# Patient Record
Sex: Female | Born: 1965
Health system: Southern US, Community
[De-identification: ages and names within clinical notes are randomized; demographics above are authoritative.]

## PROBLEM LIST (undated history)

## (undated) DIAGNOSIS — S22050D Wedge compression fracture of T5-T6 vertebra, subsequent encounter for fracture with routine healing: Secondary | ICD-10-CM

## (undated) DIAGNOSIS — N951 Menopausal and female climacteric states: Secondary | ICD-10-CM

## (undated) DIAGNOSIS — S12500A Unspecified displaced fracture of sixth cervical vertebra, initial encounter for closed fracture: Secondary | ICD-10-CM

## (undated) DIAGNOSIS — I10 Essential (primary) hypertension: Secondary | ICD-10-CM

## (undated) DIAGNOSIS — S129XXA Fracture of neck, unspecified, initial encounter: Secondary | ICD-10-CM

## (undated) DIAGNOSIS — E785 Hyperlipidemia, unspecified: Secondary | ICD-10-CM

## (undated) HISTORY — DX: Fracture of neck, unspecified, initial encounter: S12.9XXA

## (undated) HISTORY — DX: Menopausal and female climacteric states: N95.1

## (undated) HISTORY — DX: Essential (primary) hypertension: I10

## (undated) HISTORY — DX: Hyperlipidemia, unspecified: E78.5

---
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
## 2005-12-24 ENCOUNTER — Ambulatory Visit: Payer: Self-pay | Admitting: Obstetrics and Gynecology

## 2006-05-03 HISTORY — PX: BILATERAL SALPINGOOPHORECTOMY: SHX1223

## 2007-02-15 ENCOUNTER — Ambulatory Visit: Payer: Self-pay | Admitting: Obstetrics and Gynecology

## 2007-05-04 HISTORY — PX: ABDOMINAL HYSTERECTOMY: SHX81

## 2008-02-20 ENCOUNTER — Ambulatory Visit: Payer: Self-pay | Admitting: Obstetrics and Gynecology

## 2008-04-02 ENCOUNTER — Ambulatory Visit: Payer: Self-pay | Admitting: Obstetrics and Gynecology

## 2008-04-08 ENCOUNTER — Ambulatory Visit: Payer: Self-pay | Admitting: Obstetrics and Gynecology

## 2008-12-08 ENCOUNTER — Emergency Department: Payer: Self-pay | Admitting: Emergency Medicine

## 2010-02-17 ENCOUNTER — Ambulatory Visit: Payer: Self-pay | Admitting: Internal Medicine

## 2012-08-15 ENCOUNTER — Ambulatory Visit: Payer: Self-pay | Admitting: Obstetrics and Gynecology

## 2012-08-18 ENCOUNTER — Ambulatory Visit: Payer: Self-pay | Admitting: Obstetrics and Gynecology

## 2015-03-24 ENCOUNTER — Other Ambulatory Visit: Payer: Self-pay | Admitting: Neurology

## 2015-03-24 DIAGNOSIS — R202 Paresthesia of skin: Secondary | ICD-10-CM

## 2015-04-09 LAB — HEPATIC FUNCTION PANEL
ALT: 10 U/L (ref 7–35)
AST: 14 U/L (ref 13–35)

## 2015-04-09 LAB — BASIC METABOLIC PANEL
CREATININE: 0.8 mg/dL (ref 0.5–1.1)
Glucose: 86 mg/dL
Potassium: 4.7 mmol/L (ref 3.4–5.3)
SODIUM: 140 mmol/L (ref 137–147)

## 2015-04-14 ENCOUNTER — Ambulatory Visit: Payer: Self-pay

## 2015-04-16 ENCOUNTER — Ambulatory Visit
Admission: RE | Admit: 2015-04-16 | Discharge: 2015-04-16 | Disposition: A | Discharge status: Home / Self Care | Payer: Commercial Managed Care - HMO | Source: Ambulatory Visit | Attending: Neurology | Admitting: Neurology

## 2015-04-16 DIAGNOSIS — G8929 Other chronic pain: Secondary | ICD-10-CM | POA: Insufficient documentation

## 2015-04-16 DIAGNOSIS — M5032 Other cervical disc degeneration, mid-cervical region, unspecified level: Secondary | ICD-10-CM | POA: Insufficient documentation

## 2015-04-16 DIAGNOSIS — M25511 Pain in right shoulder: Secondary | ICD-10-CM | POA: Diagnosis not present

## 2015-04-16 DIAGNOSIS — R202 Paresthesia of skin: Secondary | ICD-10-CM | POA: Diagnosis present

## 2015-09-01 ENCOUNTER — Other Ambulatory Visit: Payer: Self-pay | Admitting: Internal Medicine

## 2015-09-02 ENCOUNTER — Telehealth: Payer: Self-pay | Admitting: Internal Medicine

## 2015-09-02 DIAGNOSIS — Z1239 Encounter for other screening for malignant neoplasm of breast: Secondary | ICD-10-CM

## 2015-09-02 DIAGNOSIS — N6320 Unspecified lump in the left breast, unspecified quadrant: Secondary | ICD-10-CM

## 2015-09-02 NOTE — Telephone Encounter (Signed)
the front office turned  away Dr Tami Ribas  Who is a former patient of mine.  They should not have done that without asking me!!!!!!  I have ordered the diagnostic mammogram she has requested and they need to set her up as a new patient.

## 2015-09-02 NOTE — Telephone Encounter (Signed)
Discussed with Team Lead, who will call patient today to schedule appointment.  Will discuss with Admin. Staff.

## 2015-09-03 ENCOUNTER — Other Ambulatory Visit: Payer: Self-pay | Admitting: Internal Medicine

## 2015-09-03 DIAGNOSIS — N6320 Unspecified lump in the left breast, unspecified quadrant: Secondary | ICD-10-CM

## 2015-09-05 ENCOUNTER — Encounter: Payer: Self-pay | Admitting: Internal Medicine

## 2015-09-05 ENCOUNTER — Ambulatory Visit (INDEPENDENT_AMBULATORY_CARE_PROVIDER_SITE_OTHER): Payer: 59 | Admitting: Internal Medicine

## 2015-09-05 VITALS — BP 106/76 | HR 82 | Temp 98.2°F | Resp 12 | Ht 65.0 in | Wt 140.8 lb

## 2015-09-05 DIAGNOSIS — Z90722 Acquired absence of ovaries, bilateral: Secondary | ICD-10-CM

## 2015-09-05 DIAGNOSIS — Z8249 Family history of ischemic heart disease and other diseases of the circulatory system: Secondary | ICD-10-CM

## 2015-09-05 DIAGNOSIS — S22050D Wedge compression fracture of T5-T6 vertebra, subsequent encounter for fracture with routine healing: Secondary | ICD-10-CM

## 2015-09-05 DIAGNOSIS — N63 Unspecified lump in breast: Secondary | ICD-10-CM | POA: Diagnosis not present

## 2015-09-05 DIAGNOSIS — Z0000 Encounter for general adult medical examination without abnormal findings: Secondary | ICD-10-CM

## 2015-09-05 DIAGNOSIS — E559 Vitamin D deficiency, unspecified: Secondary | ICD-10-CM

## 2015-09-05 DIAGNOSIS — Z0001 Encounter for general adult medical examination with abnormal findings: Secondary | ICD-10-CM

## 2015-09-05 DIAGNOSIS — N6320 Unspecified lump in the left breast, unspecified quadrant: Secondary | ICD-10-CM | POA: Insufficient documentation

## 2015-09-05 DIAGNOSIS — Z90710 Acquired absence of both cervix and uterus: Secondary | ICD-10-CM

## 2015-09-05 DIAGNOSIS — E785 Hyperlipidemia, unspecified: Secondary | ICD-10-CM | POA: Diagnosis not present

## 2015-09-05 DIAGNOSIS — S12500S Unspecified displaced fracture of sixth cervical vertebra, sequela: Secondary | ICD-10-CM

## 2015-09-05 DIAGNOSIS — Z9079 Acquired absence of other genital organ(s): Secondary | ICD-10-CM

## 2015-09-05 LAB — LIPID PANEL
Cholesterol: 239 mg/dL — ABNORMAL HIGH (ref 125–200)
HDL: 78 mg/dL (ref >=46)
LDL CALC: 130 mg/dL — AB (ref <130)
Total CHOL/HDL Ratio: 3.1 Ratio (ref <=5.0)
Triglycerides: 154 mg/dL — ABNORMAL HIGH (ref <150)
VLDL: 31 mg/dL — ABNORMAL HIGH (ref <30)

## 2015-09-05 LAB — LDL CHOLESTEROL, DIRECT: LDL DIRECT: 156 mg/dL — AB (ref <130)

## 2015-09-05 NOTE — Patient Instructions (Addendum)
Great to see you!  Referral to Gollan/Arida to arrange cardiac CT  Reduce vit d use to 2000 iUs daily until you hear from me

## 2015-09-05 NOTE — Progress Notes (Signed)
Patient ID: Gloria Austin, female    DOB: [birth year]  Age: 50 y.o. MRN: [id number]  The patient is here for annual  wellness examination and management of other chronic and acute problems.  Patient last seen prior to 2012.    The risk factors are reflected in the social history.  The roster of all physicians providing medical care to patient - is listed in the Snapshot section of the chart.  Activities of daily living:  The patient is 100% independent in all ADLs: dressing, toileting, feeding as well as independent mobility  Home safety : The patient has smoke detectors in the home. They wear seatbelts.  There are no firearms at home. There is no violence in the home.   There is no risks for hepatitis, STDs or HIV. There is no   history of blood transfusion. They have no travel history to infectious disease endemic areas of the world.  The patient has seen their dentist in the last six month. They have seen their eye doctor in the last year. They admit to slight hearing difficulty with regard to whispered voices and some television programs.  They have deferred audiologic testing in the last year.  They do not  have excessive sun exposure. Discussed the need for sun protection: hats, long sleeves and use of sunscreen if there is significant sun exposure.   Diet: the importance of a healthy diet is discussed. They do have a healthy diet.  The benefits of regular aerobic exercise were discussed. She walks 4 times per week ,  20 minutes.   Depression screen: there are no signs or vegative symptoms of depression- irritability, change in appetite, anhedonia, sadness/tearfullness.  Cognitive assessment: the patient manages all their financial and personal affairs and is actively engaged. They could relate day,date,year and events; recalled 2/3 objects at 3 minutes; performed clock-face test normally.  The following portions of the patient's history were reviewed and updated as appropriate:  allergies, current medications, past family history, past medical history,  past surgical history, past social history  and problem list.  Visual acuity was not assessed per patient preference since she has regular follow up with her ophthalmologist. Hearing and body mass index were assessed and reviewed.   During the course of the visit the patient was educated and counseled about appropriate screening and preventive services including : fall prevention , diabetes screening, nutrition counseling, colorectal cancer screening, and recommended immunizations.    CC: The primary encounter diagnosis was Breast mass, left. Diagnoses of Vitamin D deficiency, Hyperlipidemia, Family history of early CAD, Visit for preventive health examination, S/P TAH-BSO, Traumatic closed fracture of C6 vertebra with minimal displacement, sequela, and Closed wedge compression fracture of T6 vertebra with routine healing were also pertinent to this visit.  Patient had a stress test and EKG years ago for a dizzy and syncopal episode.  Strong FH of early CAD Lump in left breast. Stopped estrogen (post hysterectomy)   a few weeks ago Borderline cholesterol.  Walks twice daily with yoga  TAH /BSO at age 25.  On HRT ever since patch.  No testosterone or progesterone .   Last mammo 3 years ago at [facility].   Low vit d in the past   History of C6 and T 6 injury from fall off horse. Saw Manuella Ghazi   History Gloria Austin has no past medical history on file.   She has past surgical history that includes Appendectomy (1982) and Abdominal hysterectomy (2009).   Her family history  includes Cancer (age of onset: 74) in her mother; Heart disease (age of onset: 85) in her brother; Heart disease (age of onset: 20) in her father.She reports that she quit smoking about 28 years ago. Her smoking use included Cigarettes. She started smoking about 35 years ago. She does not have any smokeless tobacco history on file. She reports that she drinks about  4.2 oz of alcohol per week. She reports that she does not use illicit drugs.  No outpatient prescriptions prior to visit.   No facility-administered medications prior to visit.    Review of Systems   Patient denies headache, fevers, malaise, unintentional weight loss, skin rash, eye pain, sinus congestion and sinus pain, sore throat, dysphagia,  hemoptysis , cough, dyspnea, wheezing, chest pain, palpitations, orthopnea, edema, abdominal pain, nausea, melena, diarrhea, constipation, flank pain, dysuria, hematuria, urinary  Frequency, nocturia, numbness, tingling, seizures,  Focal weakness, Loss of consciousness,  Tremor, insomnia, depression, anxiety, and suicidal ideation.      Objective:  BP 106/76 mmHg  Pulse 82  Temp(Src) 98.2 F (36.8 C) (Oral)  Resp 12  Ht 5\' 5"  (1.651 m)  Wt 140 lb 12 oz (63.844 kg)  BMI 23.42 kg/m2  SpO2 99%  Physical Exam   General appearance: alert, cooperative and appears stated age Head: Normocephalic, without obvious abnormality, atraumatic Eyes: conjunctivae/corneas clear. PERRL, EOM's intact. Fundi benign. Ears: normal TM's and external ear canals both ears Nose: Nares normal. Septum midline. Mucosa normal. No drainage or sinus tenderness. Throat: lips, mucosa, and tongue normal; teeth and gums normal Neck: no adenopathy, no carotid bruit, no JVD, supple, symmetrical, trachea midline and thyroid not enlarged, symmetric, no tenderness/mass/nodules Lungs: clear to auscultation bilaterally Breasts: normal appearance, left breast with two small bb sized masses  At 1:00 position no masses or tenderness Heart: regular rate and rhythm, S1, S2 normal, no murmur, click, rub or gallop Abdomen: soft, non-tender; bowel sounds normal; no masses,  no organomegaly Extremities: extremities normal, atraumatic, no cyanosis or edema Pulses: 2+ and symmetric Skin: Skin color, texture, turgor normal. No rashes or lesions Neurologic: Alert and oriented X 3, normal  strength and tone. Normal symmetric reflexes. Normal coordination and gait.     Assessment & Plan:   Problem List Items Addressed This Visit    Breast mass, left - Primary    Diagnostic mammogram ordered of left breast.       Relevant Orders   Direct LDL (Completed)   Lipid panel (Completed)   VITAMIN D 25 Hydroxy (Vit-D Deficiency, Fractures) (Completed)   Visit for preventive health examination    Annual comprehensive preventive exam was done as well as an evaluation and management of chronic conditions .  During the course of the visit the patient was educated and counseled about appropriate screening and preventive services including :  diabetes screening, lipid analysis with projected  10 year  risk for CAD , nutrition counseling, breast, cervical and colorectal cancer screening, and recommended immunizations.  Printed recommendations for health maintenance screenings was given.  Labs from December 2016 [facility] were reviewed.   Lab Results  Component Value Date   CHOL 239* 09/05/2015   HDL 78 09/05/2015   LDLCALC 130* 09/05/2015   LDLDIRECT 156* 09/05/2015   TRIG 154* 09/05/2015   CHOLHDL 3.1 09/05/2015         Family history of early CAD    Discussed referral to cardiology for cardiac CT given  FH of early CAD in both father and brother.  Relevant Orders   Ambulatory referral to Cardiology   Direct LDL (Completed)   Lipid panel (Completed)   VITAMIN D 25 Hydroxy (Vit-D Deficiency, Fractures) (Completed)   Traumatic closed fracture of C6 vertebra with minimal displacement ([place])    Managed conservatively by Dr Manuella Ghazi       Closed wedge compression fracture of T6 vertebra with routine healing    secondary to fall from horse.        Other Visit Diagnoses    Vitamin D deficiency        Relevant Orders    Direct LDL (Completed)    Lipid panel (Completed)    VITAMIN D 25 Hydroxy (Vit-D Deficiency, Fractures) (Completed)    Hyperlipidemia         Relevant Orders    Direct LDL (Completed)    Lipid panel (Completed)    VITAMIN D 25 Hydroxy (Vit-D Deficiency, Fractures) (Completed)    S/P TAH-BSO           I am having Ms. Truett maintain her estradiol.  Meds ordered this encounter  Medications  . estradiol (VIVELLE-DOT) 0.075 MG/24HR    Sig: Place 1 patch onto the skin 2 (two) times a week.    There are no discontinued medications.  Follow-up: No Follow-up on file.   [name], MD

## 2015-09-05 NOTE — Progress Notes (Signed)
   Subjective:  Patient ID: Gloria Austin, female    DOB: [birth year]  Age: 50 y.o. MRN: [id number]  CC: There were no encounter diagnoses.  HPI [name] presents for new patient   History Gloria Austin has no past medical history on file.   She has past surgical history that includes Appendectomy (1982) and Abdominal hysterectomy (2009).   Her family history includes Cancer (age of onset: 40) in her mother; Heart disease (age of onset: 56) in her father.She reports that she quit smoking about 28 years ago. Her smoking use included Cigarettes. She started smoking about 35 years ago. She does not have any smokeless tobacco history on file. She reports that she drinks alcohol. She reports that she does not use illicit drugs.  No outpatient prescriptions prior to visit.   No facility-administered medications prior to visit.    Review of Systems:  Patient denies headache, fevers, malaise, unintentional weight loss, skin rash, eye pain, sinus congestion and sinus pain, sore throat, dysphagia,  hemoptysis , cough, dyspnea, wheezing, chest pain, palpitations, orthopnea, edema, abdominal pain, nausea, melena, diarrhea, constipation, flank pain, dysuria, hematuria, urinary  Frequency, nocturia, numbness, tingling, seizures,  Focal weakness, Loss of consciousness,  Tremor, insomnia, depression, anxiety, and suicidal ideation.     Objective:  BP 106/76 mmHg  Pulse 82  Temp(Src) 98.2 F (36.8 C) (Oral)  Resp 12  Ht 5\' 5"  (1.651 m)  Wt 140 lb 12 oz (63.844 kg)  BMI 23.42 kg/m2  SpO2 99%  Physical Exam:   Assessment & Plan:   Problem List Items Addressed This Visit    None      I am having Gloria Austin maintain her estradiol.  Meds ordered this encounter  Medications  . estradiol (VIVELLE-DOT) 0.075 MG/24HR    Sig: Place 1 patch onto the skin 2 (two) times a week.    There are no discontinued medications.  Follow-up: No Follow-up on file.   [name], MD

## 2015-09-05 NOTE — Progress Notes (Signed)
Pre-visit discussion using our clinic review tool. No additional management support is needed unless otherwise documented below in the visit note.  

## 2015-09-06 LAB — VITAMIN D 25 HYDROXY (VIT D DEFICIENCY, FRACTURES): Vit D, 25-Hydroxy: 34 ng/mL (ref 30–100)

## 2015-09-07 ENCOUNTER — Encounter: Payer: Self-pay | Admitting: Internal Medicine

## 2015-09-07 DIAGNOSIS — Z0000 Encounter for general adult medical examination without abnormal findings: Secondary | ICD-10-CM | POA: Insufficient documentation

## 2015-09-07 DIAGNOSIS — Z8249 Family history of ischemic heart disease and other diseases of the circulatory system: Secondary | ICD-10-CM | POA: Insufficient documentation

## 2015-09-07 DIAGNOSIS — S22050D Wedge compression fracture of T5-T6 vertebra, subsequent encounter for fracture with routine healing: Secondary | ICD-10-CM | POA: Insufficient documentation

## 2015-09-07 DIAGNOSIS — S12500A Unspecified displaced fracture of sixth cervical vertebra, initial encounter for closed fracture: Secondary | ICD-10-CM

## 2015-09-07 HISTORY — DX: Unspecified displaced fracture of sixth cervical vertebra, initial encounter for closed fracture: S12.500A

## 2015-09-07 HISTORY — DX: Wedge compression fracture of t5-T6 vertebra, subsequent encounter for fracture with routine healing: S22.050D

## 2015-09-07 NOTE — Assessment & Plan Note (Addendum)
Discussed referral to cardiology for cardiac CT given  FH of early CAD in both father and brother.

## 2015-09-07 NOTE — Assessment & Plan Note (Signed)
Diagnostic mammogram ordered of left breast  

## 2015-09-07 NOTE — Assessment & Plan Note (Signed)
secondary to fall from horse.

## 2015-09-07 NOTE — Assessment & Plan Note (Addendum)
Annual comprehensive preventive exam was done as well as an evaluation and management of chronic conditions .  During the course of the visit the patient was educated and counseled about appropriate screening and preventive services including :  diabetes screening, lipid analysis with projected  10 year  risk for CAD , nutrition counseling, breast, cervical and colorectal cancer screening, and recommended immunizations.  Printed recommendations for health maintenance screenings was given.  Labs from December 2016 [facility] were reviewed.   Lab Results  Component Value Date   CHOL 239* 09/05/2015   HDL 78 09/05/2015   LDLCALC 130* 09/05/2015   LDLDIRECT 156* 09/05/2015   TRIG 154* 09/05/2015   CHOLHDL 3.1 09/05/2015

## 2015-09-07 NOTE — Assessment & Plan Note (Signed)
Managed conservatively by Dr Manuella Ghazi

## 2015-09-09 ENCOUNTER — Encounter: Payer: Self-pay | Admitting: *Deleted

## 2015-09-11 ENCOUNTER — Ambulatory Visit
Admission: RE | Admit: 2015-09-11 | Discharge: 2015-09-11 | Disposition: A | Discharge status: Home / Self Care | Payer: 59 | Source: Ambulatory Visit | Attending: Internal Medicine | Admitting: Internal Medicine

## 2015-09-11 ENCOUNTER — Other Ambulatory Visit: Payer: Self-pay | Admitting: Internal Medicine

## 2015-09-11 DIAGNOSIS — N63 Unspecified lump in breast: Secondary | ICD-10-CM | POA: Diagnosis not present

## 2015-09-11 DIAGNOSIS — N6320 Unspecified lump in the left breast, unspecified quadrant: Secondary | ICD-10-CM

## 2015-09-11 DIAGNOSIS — R928 Other abnormal and inconclusive findings on diagnostic imaging of breast: Secondary | ICD-10-CM | POA: Insufficient documentation

## 2015-09-12 ENCOUNTER — Ambulatory Visit (INDEPENDENT_AMBULATORY_CARE_PROVIDER_SITE_OTHER): Payer: 59 | Admitting: Cardiovascular Disease

## 2015-09-12 ENCOUNTER — Encounter: Payer: Self-pay | Admitting: Cardiovascular Disease

## 2015-09-12 VITALS — BP 110/80 | HR 71 | Ht 65.0 in | Wt 139.2 lb

## 2015-09-12 DIAGNOSIS — Z8249 Family history of ischemic heart disease and other diseases of the circulatory system: Secondary | ICD-10-CM

## 2015-09-12 DIAGNOSIS — E785 Hyperlipidemia, unspecified: Secondary | ICD-10-CM

## 2015-09-12 NOTE — Progress Notes (Signed)
Patient ID: Gloria Austin, female    DOB: 05/23/1965, 50 y.o.   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 Derrel Nip for consultation of her hyperlipidemia.  Dr. Tami Ribas reports that she feels well, dizzy lifestyle, long workdays as a pediatrician locally, Tries to eat healthy, less time for routine/regular exercise. Denies any significant symptoms of shortness of breath or chest pain No recent near syncope or syncope episodes  Previous episodes of syncope felt secondary to low blood pressure. One episode after she got out of bed quickly, second episode likely secondary to pain from hematoma  She is concerned about elevated cholesterol, Strong family history; brother with coronary disease, MI at 54, bypass, defibrillator. He was a smoker Father with coronary disease at 57, bypass surgery, stenting, PAD  Currently on estrogen patch  EKG on today's visit shows normal sinus rhythm with rate 72 bpm, no significant ST or T-wave changes   No Known Allergies  Current Outpatient Prescriptions on File Prior to Visit  Medication Sig Dispense Refill  . estradiol (VIVELLE-DOT) 0.075 MG/24HR Place 1 patch onto the skin 2 (two) times a week. Reported on 09/12/2015     No current facility-administered medications on file prior to visit.    History reviewed. No pertinent past medical history.  Past Surgical History  Procedure Laterality Date  . Appendectomy  1982  . Abdominal hysterectomy  2009    total    Social History  reports that she quit smoking about 28 years ago. Her smoking use included Cigarettes. She started smoking about 35 years ago. She does not have any smokeless tobacco history on file. She reports  that she drinks about 4.2 oz of alcohol per week. She reports that she does not use illicit drugs.  Family History family history includes Cancer (age of onset: 71) in her mother; Heart attack (age of onset: 60) in her brother; Heart disease (age of onset: 20) in her brother; Heart disease (age of onset: 23) in her father.   Review of Systems  Constitutional: Negative.   Respiratory: Negative.   Cardiovascular: Negative.   Gastrointestinal: Negative.   Musculoskeletal: Negative.   Neurological: Negative.   Hematological: Negative.   Psychiatric/Behavioral: Negative.   All other systems reviewed and are negative.   BP 110/80 mmHg  Pulse 71  Ht 5\' 5"  (1.651 m)  Wt 139 lb 4 oz (63.163 kg)  BMI 23.17 kg/m2  Physical Exam  Constitutional: She is oriented to person, place, and time. She appears well-developed and well-nourished.  HENT:  Head: Normocephalic.  Nose: Nose normal.  Mouth/Throat: Oropharynx is clear and moist.  Eyes: Conjunctivae are normal. Pupils are equal, round, and reactive to light.  Neck: Normal range of motion. Neck supple. No JVD present.  Cardiovascular: Normal rate, regular rhythm, normal heart sounds and intact distal pulses.  Exam reveals no gallop and no friction rub.   No murmur heard. Pulmonary/Chest: Effort normal and breath sounds normal. No respiratory distress. She has no wheezes. She has no rales. She exhibits no tenderness.  Abdominal: Soft. Bowel sounds are normal. She exhibits no distension. There is no tenderness.  Musculoskeletal: Normal range of motion. She exhibits no edema or tenderness.  Lymphadenopathy:    She has no cervical adenopathy.  Neurological:  She is alert and oriented to person, place, and time. Coordination normal.  Skin: Skin is warm and dry. No rash noted. No erythema.  Psychiatric: She has a normal mood and affect. Her behavior is normal. Judgment and thought content normal.

## 2015-09-12 NOTE — Patient Instructions (Addendum)
You are doing well. No medication changes were made.  We will schedule a CT coronary calcium score for family history, high cholesterol Thursday, May 18 @ 2:00, please arrive @ 1:45 There is a one-time fee of $150 due at the time of your procedure [address] [place]  Please call us if you have new issues that need to be addressed before your next appt.    Coronary Calcium Scan A coronary calcium scan is an imaging test used to look for deposits of calcium and other fatty materials (plaques) in the inner lining of the blood vessels of your heart (coronary arteries). These deposits of calcium and plaques can partly clog and narrow the coronary arteries without producing any symptoms or warning signs. This puts you at risk for a heart attack. This test can detect these deposits before symptoms develop.  LET [facility] CARE PROVIDER KNOW ABOUT:  Any allergies you have.  All medicines you are taking, including vitamins, herbs, eye drops, creams, and over-the-counter medicines.  Previous problems you or members of your family have had with the use of anesthetics.  Any blood disorders you have.  Previous surgeries you have had.  Medical conditions you have.  Possibility of pregnancy, if this applies. RISKS AND COMPLICATIONS Generally, this is a safe procedure. However, as with any procedure, complications can occur. This test involves the use of radiation. Radiation exposure can be dangerous to a pregnant woman and her unborn baby. If you are pregnant, you should not have this procedure done.  BEFORE THE PROCEDURE There is no special preparation for the procedure. PROCEDURE  You will need to undress and put on a hospital gown. You will need to remove any jewelry around your neck or chest.  Sticky electrodes are placed on your chest and are connected to an electrocardiogram (EKG or electrocardiography) machine to recorda tracing of the electrical  activity of your heart.  A CT scanner will take pictures of your heart. During this time, you will be asked to lie still and hold your breath for 2-3 seconds while a picture is being taken of your heart. AFTER THE PROCEDURE   You will be allowed to get dressed.  You can return to your normal activities after the scan is done.   This information is not intended to replace advice given to you by your health care provider. Make sure you discuss any questions you have with your health care provider.   Document Released: 10/16/2007 Document Revised: 04/24/2013 Document Reviewed: 12/25/2012 Elsevier Interactive Patient Education [employer].

## 2015-09-12 NOTE — Assessment & Plan Note (Signed)
LDL in the 130 range, Long discussion concerning her numbers. Recommended we use the CT coronary calcium score as a guide. If score is elevated, would likely recommend more aggressive management of the cholesterol. If score is low, could likely treat with lifestyle management only. We did discuss other lab work such as an NMR Lipoprofile, CRP and Lipoprotein (a)

## 2015-09-12 NOTE — Assessment & Plan Note (Signed)
Strong family history of coronary artery disease, brother and father Cholesterol is mildly elevated Recommended a routine screening study, CT coronary calcium score This will be arranged for next week at her convenience If score is 0, would continue aggressive cholesterol management If score is markedly elevated, could consider adding a statin

## 2015-09-18 ENCOUNTER — Ambulatory Visit (INDEPENDENT_AMBULATORY_CARE_PROVIDER_SITE_OTHER)
Admission: RE | Admit: 2015-09-18 | Discharge: 2015-09-18 | Disposition: A | Discharge status: Home / Self Care | Payer: Self-pay | Source: Ambulatory Visit | Attending: Cardiovascular Disease | Admitting: Cardiovascular Disease

## 2015-09-18 DIAGNOSIS — Z8249 Family history of ischemic heart disease and other diseases of the circulatory system: Secondary | ICD-10-CM

## 2015-09-18 DIAGNOSIS — E785 Hyperlipidemia, unspecified: Secondary | ICD-10-CM

## 2015-09-20 NOTE — Telephone Encounter (Signed)
  I have reviewed the above information and agree with above.   Teresa Tullo, MD 

## 2015-10-23 ENCOUNTER — Telehealth: Payer: Self-pay | Admitting: Internal Medicine

## 2015-10-24 ENCOUNTER — Other Ambulatory Visit: Payer: Self-pay | Admitting: Internal Medicine

## 2015-10-24 ENCOUNTER — Encounter: Payer: Self-pay | Admitting: Internal Medicine

## 2015-10-24 MED ORDER — PAROXETINE HCL 10 MG PO TABS: 10.0000 mg | ORAL_TABLET | Freq: Every day | ORAL | Status: DC

## 2015-10-24 MED ORDER — ESTRADIOL 0.05 MG/24HR TD PTTW: 1.0000 | MEDICATED_PATCH | TRANSDERMAL | Status: DC

## 2016-04-22 DIAGNOSIS — H5203 Hypermetropia, bilateral: Secondary | ICD-10-CM | POA: Diagnosis not present

## 2016-07-26 DIAGNOSIS — M5442 Lumbago with sciatica, left side: Secondary | ICD-10-CM | POA: Diagnosis not present

## 2016-08-20 ENCOUNTER — Other Ambulatory Visit: Payer: Self-pay | Admitting: Internal Medicine

## 2016-11-18 ENCOUNTER — Other Ambulatory Visit: Payer: Self-pay | Admitting: Internal Medicine

## 2017-01-26 NOTE — Telephone Encounter (Signed)
Error

## 2017-05-02 IMAGING — MG MM DIGITAL DIAGNOSTIC BILAT W/ TOMO W/ CAD
9 of 15 series · 9 of 35 positions shown · non-contrast
Comparison: Previous exam(s).

CLINICAL DATA: Left breast lump at 3 o'clock for 3 weeks.

EXAM:
2D DIGITAL DIAGNOSTIC BILATERAL MAMMOGRAM WITH CAD AND ADJUNCT TOMO
ULTRASOUND LEFT BREAST

[R MLO synth-2D]
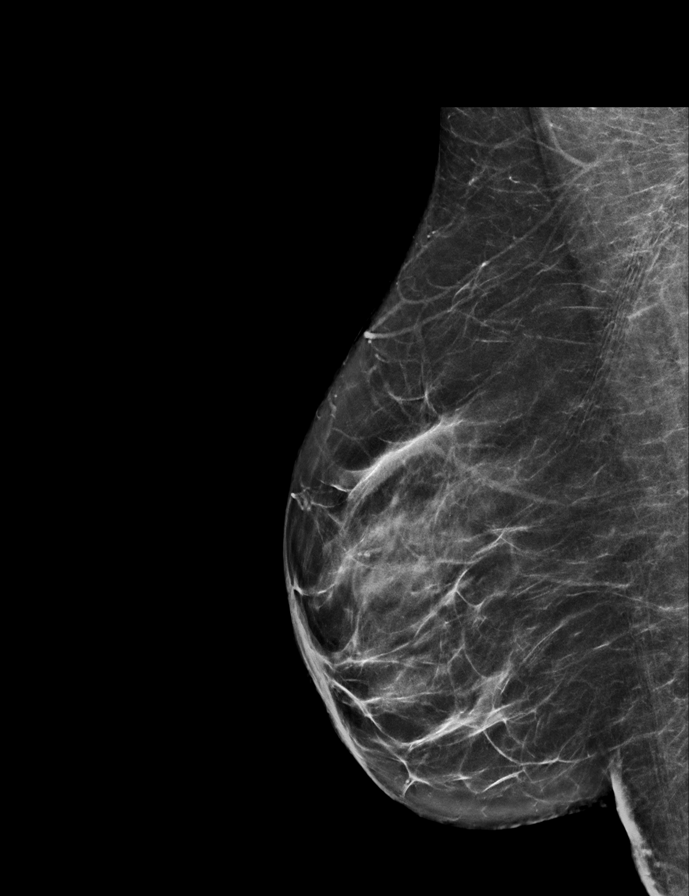

[L XCCL synth-2D]
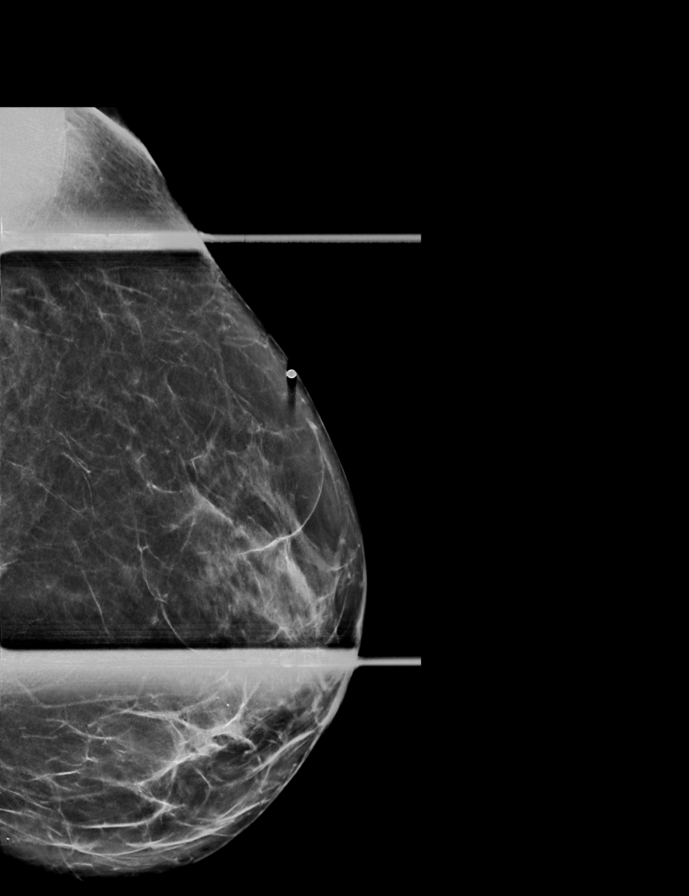

[L MLO]
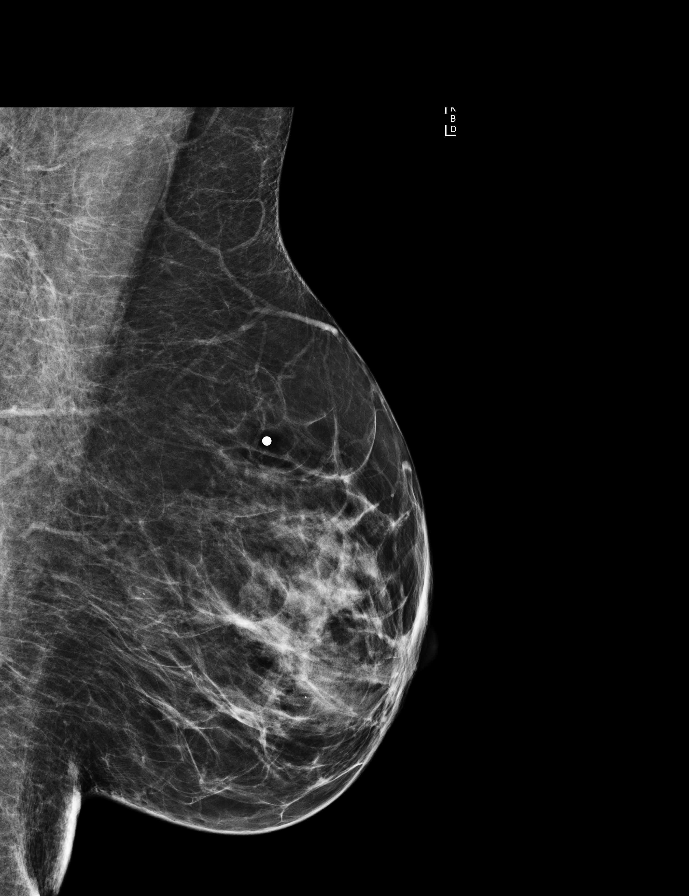

[R MLO]
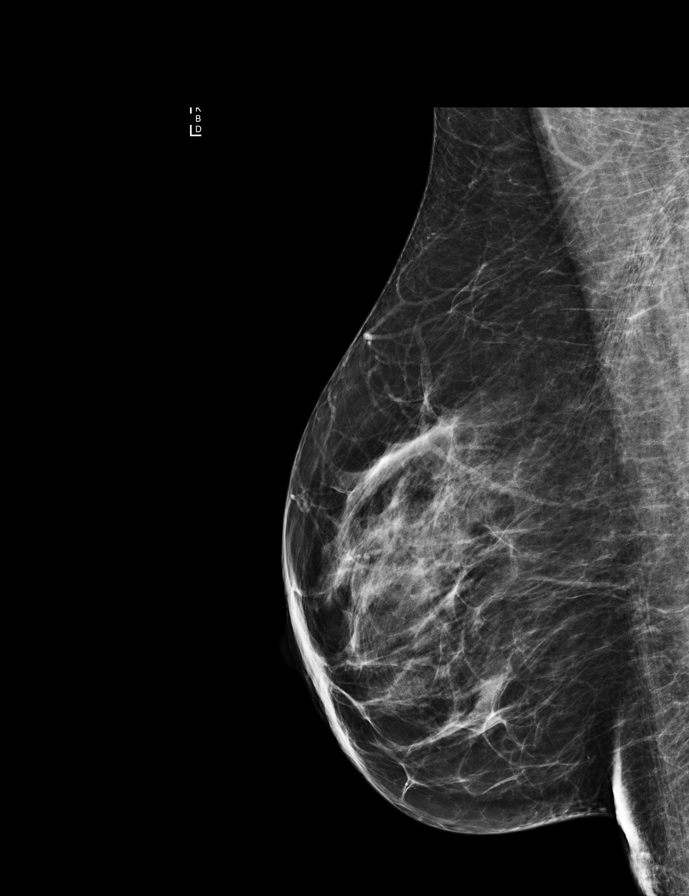

[R CC]
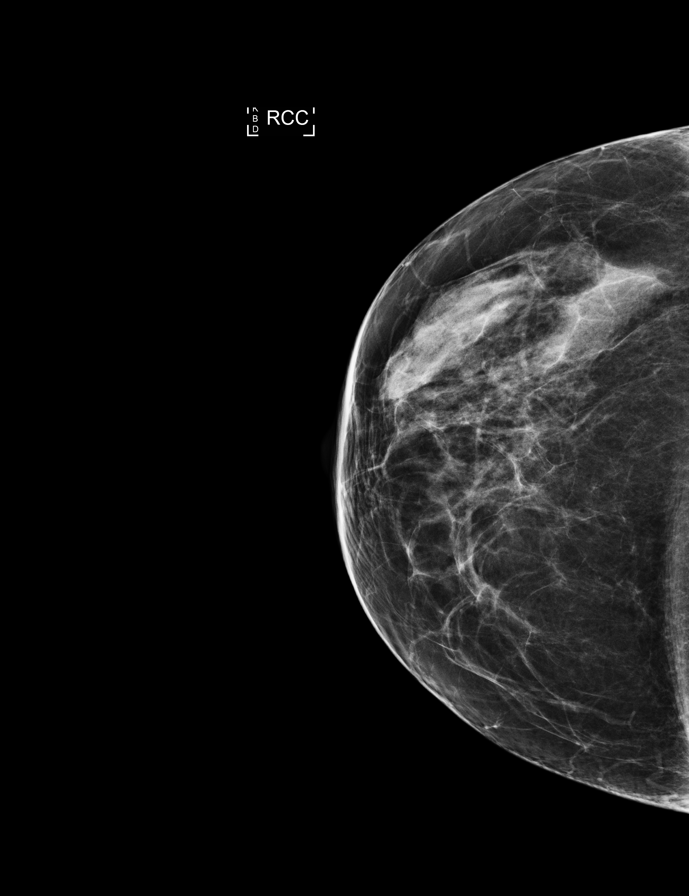

[L CC synth-2D]
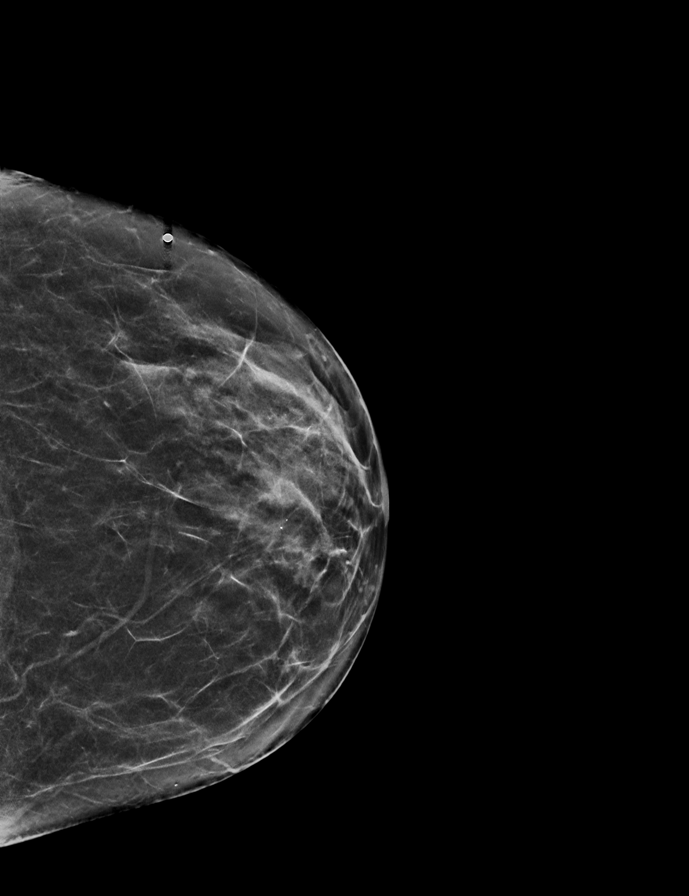

[R CC synth-2D]
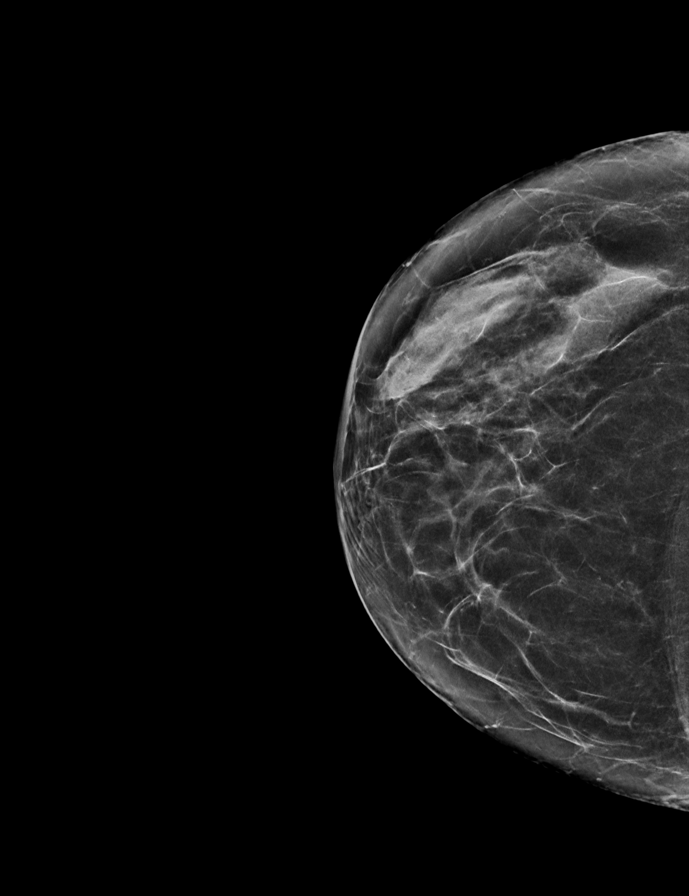

[L CC]
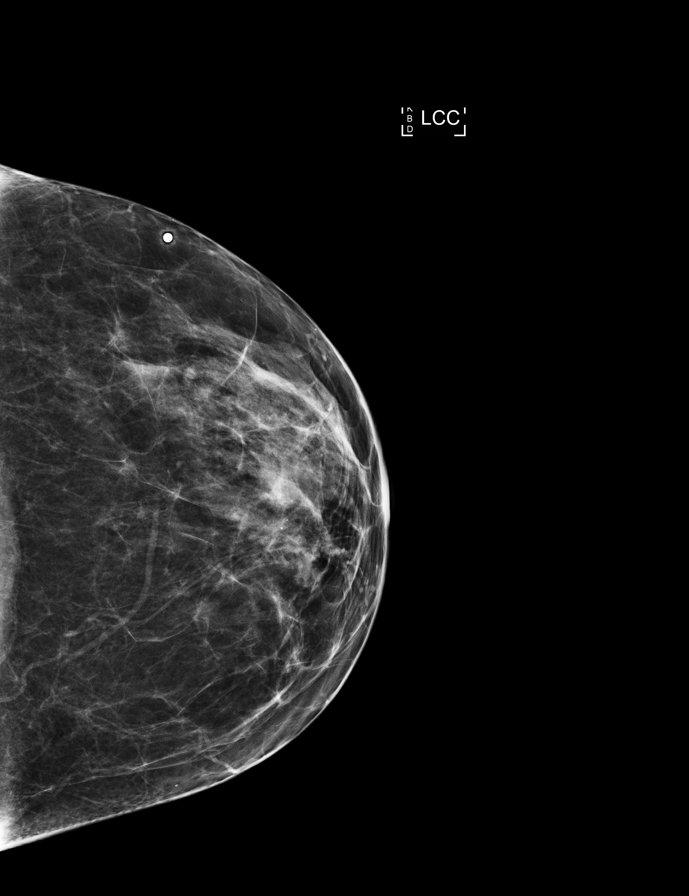

[L MLO synth-2D]
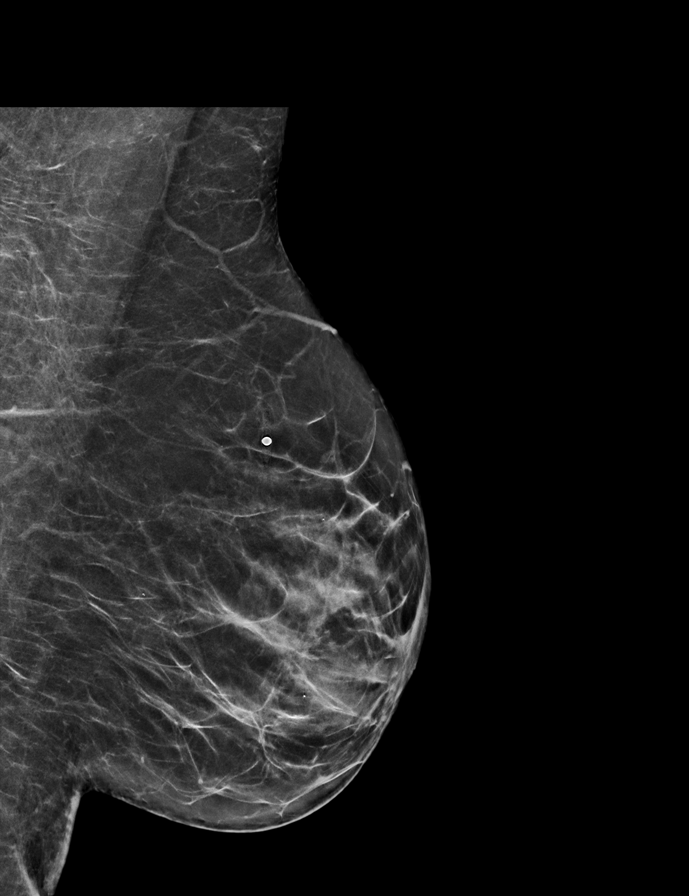

[9 of 35 positions shown; findings below may reference images not displayed]

ACR Breast Density Category c: The breast tissue is heterogeneously
dense, which may obscure small masses.
FINDINGS: No suspicious mass, calcifications, or other abnormality is
identified within either breast.

Mammographic images were processed with CAD.

On physical exam, no discrete mass is felt in the patient's area of
concern within the upper, outer left breast.

Targeted ultrasound is performed, showing no suspicious cystic or
solid sonographic finding in the area of concern within the upper,
outer left breast.
IMPRESSION: No mammographic or sonographic evidence of malignancy.

RECOMMENDATION:
Screening mammogram in one year.(Code:[id number]-[id number])

I have discussed the findings and recommendations with the patient.
Results were also provided in writing at the conclusion of the
visit. If applicable, a reminder letter will be sent to the patient
regarding the next appointment.

BI-RADS CATEGORY  1: Negative.

## 2017-10-27 ENCOUNTER — Encounter: Payer: Self-pay | Admitting: Internal Medicine

## 2017-10-27 ENCOUNTER — Telehealth: Payer: Self-pay | Admitting: Internal Medicine

## 2017-10-27 ENCOUNTER — Ambulatory Visit (INDEPENDENT_AMBULATORY_CARE_PROVIDER_SITE_OTHER): Payer: 59 | Admitting: Internal Medicine

## 2017-10-27 VITALS — BP 136/94 | HR 71 | Temp 98.1°F | Resp 15 | Ht 65.0 in | Wt 144.4 lb

## 2017-10-27 DIAGNOSIS — Z1211 Encounter for screening for malignant neoplasm of colon: Secondary | ICD-10-CM

## 2017-10-27 DIAGNOSIS — R030 Elevated blood-pressure reading, without diagnosis of hypertension: Secondary | ICD-10-CM

## 2017-10-27 DIAGNOSIS — Z1239 Encounter for other screening for malignant neoplasm of breast: Secondary | ICD-10-CM

## 2017-10-27 DIAGNOSIS — Z0000 Encounter for general adult medical examination without abnormal findings: Secondary | ICD-10-CM

## 2017-10-27 DIAGNOSIS — R922 Inconclusive mammogram: Secondary | ICD-10-CM

## 2017-10-27 DIAGNOSIS — Z1231 Encounter for screening mammogram for malignant neoplasm of breast: Secondary | ICD-10-CM | POA: Diagnosis not present

## 2017-10-27 DIAGNOSIS — L719 Rosacea, unspecified: Secondary | ICD-10-CM | POA: Diagnosis not present

## 2017-10-27 DIAGNOSIS — I10 Essential (primary) hypertension: Secondary | ICD-10-CM | POA: Insufficient documentation

## 2017-10-27 DIAGNOSIS — Z8249 Family history of ischemic heart disease and other diseases of the circulatory system: Secondary | ICD-10-CM | POA: Diagnosis not present

## 2017-10-27 DIAGNOSIS — E782 Mixed hyperlipidemia: Secondary | ICD-10-CM | POA: Diagnosis not present

## 2017-10-27 DIAGNOSIS — L820 Inflamed seborrheic keratosis: Secondary | ICD-10-CM | POA: Diagnosis not present

## 2017-10-27 DIAGNOSIS — L99 Other disorders of skin and subcutaneous tissue in diseases classified elsewhere: Secondary | ICD-10-CM | POA: Diagnosis not present

## 2017-10-27 LAB — COMPREHENSIVE METABOLIC PANEL
ALBUMIN: 4.8 g/dL (ref 3.5–5.2)
ALT: 12 U/L (ref 0–35)
AST: 15 U/L (ref 0–37)
Alkaline Phosphatase: 53 U/L (ref 39–117)
BILIRUBIN TOTAL: 0.6 mg/dL (ref 0.2–1.2)
BUN: 19 mg/dL (ref 6–23)
CALCIUM: 9.8 mg/dL (ref 8.4–10.5)
CO2: 30 mEq/L (ref 19–32)
CREATININE: 0.81 mg/dL (ref 0.40–1.20)
Chloride: 99 mEq/L (ref 96–112)
GFR: 78.64 mL/min (ref >60.00)
GLUCOSE: 97 mg/dL (ref 70–99)
Potassium: 3.9 mEq/L (ref 3.5–5.1)
SODIUM: 136 meq/L (ref 135–145)
Total Protein: 7.6 g/dL (ref 6.0–8.3)

## 2017-10-27 LAB — MICROALBUMIN / CREATININE URINE RATIO
Creatinine,U: 35.1 mg/dL
Microalb Creat Ratio: 2 mg/g (ref 0.0–30.0)

## 2017-10-27 LAB — LDL CHOLESTEROL, DIRECT: Direct LDL: 167 mg/dL

## 2017-10-27 LAB — TSH: TSH: 4.03 u[IU]/mL (ref 0.35–4.50)

## 2017-10-27 NOTE — Telephone Encounter (Signed)
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

## 2017-10-27 NOTE — Assessment & Plan Note (Signed)
Persistent on repeat assessment.  Patient ate at a local restaurant last evening and for the third time had a reaction including headache and palpitations; may be MSG reaction.  Repeat assessment f BP to be done at [place] by husband who is an MD.  Renal function and urinalysis ordered

## 2017-10-27 NOTE — Assessment & Plan Note (Signed)
Cardiac ct was done and her score was zero.   Given her ten year risk of CAD being low at 3% using FRC in 2017,  No further testing until 2022

## 2017-10-27 NOTE — Assessment & Plan Note (Signed)
Annual comprehensive preventive exam was done as well as an evaluation and management of chronic conditions .  During the course of the visit the patient was educated and counseled about appropriate screening and preventive services including :  diabetes screening, lipid analysis with projected  10 year  risk for CAD , nutrition counseling, breast, cervical and colorectal cancer screening, and recommended immunizations.  Printed recommendations for health maintenance screenings was given  S/p TAH/BSO Off HRT after ten years Needs mammogram and colonoscopy; both ordered Cardiac CT done due to [place] of CAD : caclium score zero 10 yr risk of CAD 3% by 2017  Lipid panel

## 2017-10-27 NOTE — Patient Instructions (Addendum)
As long as your urinalysis is negative for protein and your next home reading is  150/90,  I suggest we give you 3 months to lower BP through lifestyle changes   Mammogram and GI referral have been ordered    Health Maintenance for Postmenopausal Women Menopause is a normal process in which your reproductive ability comes to an end. This process happens gradually over a span of months to years, usually between the ages of 45 and 61. Menopause is complete when you have missed 12 consecutive menstrual periods. It is important to talk with your health care provider about some of the most common conditions that affect postmenopausal women, such as heart disease, cancer, and bone loss (osteoporosis). Adopting a healthy lifestyle and getting preventive care can help to promote your health and wellness. Those actions can also lower your chances of developing some of these common conditions. What should I know about menopause? During menopause, you may experience a number of symptoms, such as:  Moderate-to-severe hot flashes.  Night sweats.  Decrease in sex drive.  Mood swings.  Headaches.  Tiredness.  Irritability.  Memory problems.  Insomnia.  Choosing to treat or not to treat menopausal changes is an individual decision that you make with your health care provider. What should I know about hormone replacement therapy and supplements? Hormone therapy products are effective for treating symptoms that are associated with menopause, such as hot flashes and night sweats. Hormone replacement carries certain risks, especially as you become older. If you are thinking about using estrogen or estrogen with progestin treatments, discuss the benefits and risks with your health care provider. What should I know about heart disease and stroke? Heart disease, heart attack, and stroke become more likely as you age. This may be due, in part, to the hormonal changes that your body experiences during  menopause. These can affect how your body processes dietary fats, triglycerides, and cholesterol. Heart attack and stroke are both medical emergencies. There are many things that you can do to help prevent heart disease and stroke:  Have your blood pressure checked at least every 1-2 years. High blood pressure causes heart disease and increases the risk of stroke.  If you are 70-96 years old, ask your health care provider if you should take aspirin to prevent a heart attack or a stroke.  Do not use any tobacco products, including cigarettes, chewing tobacco, or electronic cigarettes. If you need help quitting, ask your health care provider.  It is important to eat a healthy diet and maintain a healthy weight. ? Be sure to include plenty of vegetables, fruits, low-fat dairy products, and lean protein. ? Avoid eating foods that are high in solid fats, added sugars, or salt (sodium).  Get regular exercise. This is one of the most important things that you can do for your health. ? Try to exercise for at least 150 minutes each week. The type of exercise that you do should increase your heart rate and make you sweat. This is known as moderate-intensity exercise. ? Try to do strengthening exercises at least twice each week. Do these in addition to the moderate-intensity exercise.  Know your numbers.Ask your health care provider to check your cholesterol and your blood glucose. Continue to have your blood tested as directed by your health care provider.  What should I know about cancer screening? There are several types of cancer. Take the following steps to reduce your risk and to catch any cancer development as early as possible.  Breast Cancer  Practice breast self-awareness. ? This means understanding how your breasts normally appear and feel. ? It also means doing regular breast self-exams. Let your health care provider know about any changes, no matter how small.  If you are 40 or older,  have a clinician do a breast exam (clinical breast exam or CBE) every year. Depending on your age, family history, and medical history, it may be recommended that you also have a yearly breast X-ray (mammogram).  If you have a family history of breast cancer, talk with your health care provider about genetic screening.  If you are at high risk for breast cancer, talk with your health care provider about having an MRI and a mammogram every year.  Breast cancer (BRCA) gene test is recommended for women who have family members with BRCA-related cancers. Results of the assessment will determine the need for genetic counseling and BRCA1 and for BRCA2 testing. BRCA-related cancers include these types: ? Breast. This occurs in males or females. ? Ovarian. ? Tubal. This may also be called fallopian tube cancer. ? Cancer of the abdominal or pelvic lining (peritoneal cancer). ? Prostate. ? Pancreatic.  Cervical, Uterine, and Ovarian Cancer Your health care provider may recommend that you be screened regularly for cancer of the pelvic organs. These include your ovaries, uterus, and vagina. This screening involves a pelvic exam, which includes checking for microscopic changes to the surface of your cervix (Pap test).  For women ages 21-65, health care providers may recommend a pelvic exam and a Pap test every three years. For women ages 30-65, they may recommend the Pap test and pelvic exam, combined with testing for human papilloma virus (HPV), every five years. Some types of HPV increase your risk of cervical cancer. Testing for HPV may also be done on women of any age who have unclear Pap test results.  Other health care providers may not recommend any screening for nonpregnant women who are considered low risk for pelvic cancer and have no symptoms. Ask your health care provider if a screening pelvic exam is right for you.  If you have had past treatment for cervical cancer or a condition that could  lead to cancer, you need Pap tests and screening for cancer for at least 20 years after your treatment. If Pap tests have been discontinued for you, your risk factors (such as having a new sexual partner) need to be reassessed to determine if you should start having screenings again. Some women have medical problems that increase the chance of getting cervical cancer. In these cases, your health care provider may recommend that you have screening and Pap tests more often.  If you have a family history of uterine cancer or ovarian cancer, talk with your health care provider about genetic screening.  If you have vaginal bleeding after reaching menopause, tell your health care provider.  There are currently no reliable tests available to screen for ovarian cancer.  Lung Cancer Lung cancer screening is recommended for adults 55-80 years old who are at high risk for lung cancer because of a history of smoking. A yearly low-dose CT scan of the lungs is recommended if you:  Currently smoke.  Have a history of at least 30 pack-years of smoking and you currently smoke or have quit within the past 15 years. A pack-year is smoking an average of one pack of cigarettes per day for one year.  Yearly screening should:  Continue until it has been 15 years since   you quit.  Stop if you develop a health problem that would prevent you from having lung cancer treatment.  Colorectal Cancer  This type of cancer can be detected and can often be prevented.  Routine colorectal cancer screening usually begins at age 50 and continues through age 75.  If you have risk factors for colon cancer, your health care provider may recommend that you be screened at an earlier age.  If you have a family history of colorectal cancer, talk with your health care provider about genetic screening.  Your health care provider may also recommend using home test kits to check for hidden blood in your stool.  A small camera at the  end of a tube can be used to examine your colon directly (sigmoidoscopy or colonoscopy). This is done to check for the earliest forms of colorectal cancer.  Direct examination of the colon should be repeated every 5-10 years until age 75. However, if early forms of precancerous polyps or small growths are found or if you have a family history or genetic risk for colorectal cancer, you may need to be screened more often.  Skin Cancer  Check your skin from head to toe regularly.  Monitor any moles. Be sure to tell your health care provider: ? About any new moles or changes in moles, especially if there is a change in a mole's shape or color. ? If you have a mole that is larger than the size of a pencil eraser.  If any of your family members has a history of skin cancer, especially at a young age, talk with your health care provider about genetic screening.  Always use sunscreen. Apply sunscreen liberally and repeatedly throughout the day.  Whenever you are outside, protect yourself by wearing long sleeves, pants, a wide-brimmed hat, and sunglasses.  What should I know about osteoporosis? Osteoporosis is a condition in which bone destruction happens more quickly than new bone creation. After menopause, you may be at an increased risk for osteoporosis. To help prevent osteoporosis or the bone fractures that can happen because of osteoporosis, the following is recommended:  If you are 19-50 years old, get at least 1,000 mg of calcium and at least 600 mg of vitamin D per day.  If you are older than age 50 but younger than age 70, get at least 1,200 mg of calcium and at least 600 mg of vitamin D per day.  If you are older than age 70, get at least 1,200 mg of calcium and at least 800 mg of vitamin D per day.  Smoking and excessive alcohol intake increase the risk of osteoporosis. Eat foods that are rich in calcium and vitamin D, and do weight-bearing exercises several times each week as directed  by your health care provider. What should I know about how menopause affects my mental health? Depression may occur at any age, but it is more common as you become older. Common symptoms of depression include:  Low or sad mood.  Changes in sleep patterns.  Changes in appetite or eating patterns.  Feeling an overall lack of motivation or enjoyment of activities that you previously enjoyed.  Frequent crying spells.  Talk with your health care provider if you think that you are experiencing depression. What should I know about immunizations? It is important that you get and maintain your immunizations. These include:  Tetanus, diphtheria, and pertussis (Tdap) booster vaccine.  Influenza every year before the flu season begins.  Pneumonia vaccine.  Shingles   vaccine.  Your health care provider may also recommend other immunizations. This information is not intended to replace advice given to you by your health care provider. Make sure you discuss any questions you have with your health care provider. Document Released: 06/11/2005 Document Revised: 11/07/2015 Document Reviewed: 01/21/2015 Elsevier Interactive Patient Education  2018 [employer].

## 2017-10-27 NOTE — Assessment & Plan Note (Signed)
Her ten year risk of CAD in 2017 ws 3% using the FRC.   Repeat screening in 2022

## 2017-10-27 NOTE — Progress Notes (Signed)
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 MRN: [id number]  The patient is here for annual preventive examination and management of other chronic and acute problems.   Has been off of estrogen after ten years of HRT   The risk factors are reflected in the social history.  The roster of all physicians providing medical care to patient - is listed in the Snapshot section of the chart.  Activities of daily living:  The patient is 100% independent in all ADLs: dressing, toileting, feeding as well as independent mobility  Home safety : The patient has smoke detectors in the home. They wear seatbelts.  There are no firearms at home. There is no violence in the home.   There is no risks for hepatitis, STDs or HIV. There is no   history of blood transfusion. They have no travel history to infectious disease endemic areas of the world.  The patient has seen their dentist in the last six month. They have seen their eye doctor in the last year.   They have deferred audiologic testing in the last year.  They do not  have excessive sun exposure. Discussed the need for sun protection: hats, long sleeves and use of sunscreen if there is significant sun exposure.   Diet: the importance of a healthy diet is discussed. They do have a healthy diet.  The benefits of regular aerobic exercise were discussed. She walks 3 times per week ,  20 minutes.   Depression screen: there are no signs or vegative symptoms of depression- irritability, change in appetite, anhedonia, sadness/tearfullness.   The following portions of the patient's history were reviewed and updated as appropriate: allergies, current medications, past family history, past medical history,  past surgical history, past social history  and problem list.  Visual acuity was not assessed per patient preference since she has regular follow up with her ophthalmologist. Hearing and body mass index were assessed and  reviewed.   During the course of the visit the patient was educated and counseled about appropriate screening and preventive services including : fall prevention , diabetes screening, nutrition counseling, colorectal cancer screening, and recommended immunizations.    CC: The primary encounter diagnosis was Colon cancer screening. Diagnoses of Visit for preventive health examination, Elevated blood pressure reading without diagnosis of hypertension, Breast cancer screening, Mixed hyperlipidemia, Elevated blood pressure reading in office without diagnosis of hypertension, Family history of early CAD, and Dense breast tissue on mammogram were also pertinent to this visit.  History Gloria Austin has no past medical history on file.   She has a past surgical history that includes Appendectomy (1982); Abdominal hysterectomy (2009); and Bilateral salpingoophorectomy (2008).   Her family history includes Cancer (age of onset: 59) in her mother; Heart attack (age of onset: 79) in her brother; Heart disease (age of onset: 98) in her brother; Heart disease (age of onset: 92) in her father.She reports that she quit smoking about 30 years ago. Her smoking use included cigarettes. She started smoking about 37 years ago. She has never used smokeless tobacco. She reports that she drinks about 4.2 oz of alcohol per week. She reports that she does not use drugs.  Outpatient Medications Prior to Visit  Medication Sig Dispense Refill  . estradiol (VIVELLE-DOT) 0.05 MG/24HR patch PLACE 1 PATCH ONTO THE SKIN 2 TIMES A WEEK. (Patient not taking: Reported on 10/27/2017) 8 patch 12  . estradiol (VIVELLE-DOT) 0.075 MG/24HR Place 1 patch onto  the skin 2 (two) times a week. Reported on 09/12/2015    . PARoxetine (PAXIL) 10 MG tablet TAKE 1 TABLET (10 MG TOTAL) BY MOUTH DAILY. (Patient not taking: Reported on 10/27/2017) 90 tablet 1   No facility-administered medications prior to visit.     Review of Systems  Objective:  BP (!)  136/94 (BP Location: Left Arm, Patient Position: Sitting, Cuff Size: Normal)   Pulse 71   Temp 98.1 F (36.7 C) (Oral)   Resp 15   Ht 5\' 5"  (1.651 m)   Wt 144 lb 6.4 oz (65.5 kg)   SpO2 97%   BMI 24.03 kg/m   Physical Exam    Assessment & Plan:   Problem List Items Addressed This Visit    Encounter for preventive health examination    Annual comprehensive preventive exam was done as well as an evaluation and management of chronic conditions .  During the course of the visit the patient was educated and counseled about appropriate screening and preventive services including :  diabetes screening, lipid analysis with projected  10 year  risk for CAD , nutrition counseling, breast, cervical and colorectal cancer screening, and recommended immunizations.  Printed recommendations for health maintenance screenings was given  S/p TAH/BSO Off HRT after ten years Needs mammogram and colonoscopy; both ordered Cardiac CT done due to [place] of CAD : caclium score zero 10 yr risk of CAD 3% by 2017  Lipid panel      Family history of early CAD    Cardiac ct was done and her score was zero.   Given her ten year risk of CAD being low at 3% using FRC in 2017,  No further testing until 2022      Hyperlipidemia    Her ten year risk of CAD in 2017 ws 3% using the FRC.   Repeat screening in 2022      Relevant Orders   LDL cholesterol, direct   Elevated blood pressure reading in office without diagnosis of hypertension    Persistent on repeat assessment.  Patient ate at a local restaurant last evening and for the third time had a reaction including headache and palpitations; may be MSG reaction.  Repeat assessment f BP to be done at [facility] by husband who is an MD.  Renal function and urinalysis ordered      Dense breast tissue on mammogram    Recommend to resume annual screening mammograns using 3D modality       Other Visit Diagnoses    Colon cancer screening    -  Primary   Relevant Orders    Ambulatory referral to Gastroenterology   Elevated blood pressure reading without diagnosis of hypertension       Relevant Orders   Comprehensive metabolic panel   TSH   Microalbumin / creatinine urine ratio   Breast cancer screening       Relevant Orders   MM 3D SCREEN BREAST BILATERAL      I have discontinued Dr. Lucy Antigua, MD's estradiol, PARoxetine, and estradiol.  No orders of the defined types were placed in this encounter.   Medications Discontinued During This Encounter  Medication Reason  . estradiol (VIVELLE-DOT) 0.05 MG/24HR patch Patient has not taken in last 30 days  . estradiol (VIVELLE-DOT) 0.075 MG/24HR Patient has not taken in last 30 days  . PARoxetine (PAXIL) 10 MG tablet Patient has not taken in last 30 days    Follow-up: No follow-ups on file.  [name], MD

## 2017-10-27 NOTE — Assessment & Plan Note (Signed)
Recommend to resume annual screening mammograns using 3D modality

## 2017-11-11 ENCOUNTER — Encounter: Payer: Self-pay | Admitting: *Deleted

## 2017-12-01 ENCOUNTER — Ambulatory Visit
Admission: RE | Admit: 2017-12-01 | Discharge: 2017-12-01 | Disposition: A | Discharge status: Home / Self Care | Payer: 59 | Source: Ambulatory Visit | Attending: Internal Medicine | Admitting: Internal Medicine

## 2017-12-01 DIAGNOSIS — Z1231 Encounter for screening mammogram for malignant neoplasm of breast: Secondary | ICD-10-CM | POA: Diagnosis not present

## 2017-12-01 DIAGNOSIS — Z1239 Encounter for other screening for malignant neoplasm of breast: Secondary | ICD-10-CM

## 2018-01-12 ENCOUNTER — Other Ambulatory Visit: Payer: Self-pay | Admitting: Internal Medicine

## 2018-01-12 DIAGNOSIS — N951 Menopausal and female climacteric states: Secondary | ICD-10-CM

## 2018-01-12 MED ORDER — ESTRADIOL 0.05 MG/24HR TD PTTW: 1.0000 | MEDICATED_PATCH | TRANSDERMAL | 12 refills | Status: DC

## 2018-01-12 NOTE — Assessment & Plan Note (Signed)
Symptoms uncontrolled since stopping HRT and sleep is disrupted.  Restarting HRT

## 2018-02-17 ENCOUNTER — Other Ambulatory Visit: Payer: Self-pay | Admitting: Internal Medicine

## 2018-02-17 MED ORDER — PAROXETINE HCL 10 MG PO TABS: 10.0000 mg | ORAL_TABLET | Freq: Every day | ORAL | 1 refills | Status: DC

## 2018-11-19 ENCOUNTER — Other Ambulatory Visit: Payer: Self-pay | Admitting: Internal Medicine

## 2018-11-19 DIAGNOSIS — Z0000 Encounter for general adult medical examination without abnormal findings: Secondary | ICD-10-CM

## 2018-11-19 MED ORDER — PROPRANOLOL HCL ER 80 MG PO CP24
80.0000 mg | ORAL_CAPSULE | Freq: Every day | ORAL | 3 refills | Status: DC
Start: 2018-11-19 — End: 2019-02-05

## 2018-11-19 NOTE — Assessment & Plan Note (Signed)
Starting Inderal LA for home readings of 034-03/[id number]

## 2019-02-05 ENCOUNTER — Other Ambulatory Visit: Payer: Self-pay | Admitting: Internal Medicine

## 2019-02-05 MED ORDER — METOPROLOL SUCCINATE ER 25 MG PO TB24: 25.0000 mg | ORAL_TABLET | Freq: Every day | ORAL | 3 refills | Status: DC

## 2019-02-08 ENCOUNTER — Other Ambulatory Visit: Payer: Self-pay

## 2019-02-08 ENCOUNTER — Encounter: Payer: Self-pay | Admitting: Internal Medicine

## 2019-02-08 ENCOUNTER — Ambulatory Visit (INDEPENDENT_AMBULATORY_CARE_PROVIDER_SITE_OTHER): Payer: 59 | Admitting: Internal Medicine

## 2019-02-08 VITALS — BP 130/86 | HR 67 | Temp 97.5°F | Resp 14 | Ht 65.0 in | Wt 144.8 lb

## 2019-02-08 DIAGNOSIS — R5383 Other fatigue: Secondary | ICD-10-CM | POA: Diagnosis not present

## 2019-02-08 DIAGNOSIS — Z8669 Personal history of other diseases of the nervous system and sense organs: Secondary | ICD-10-CM | POA: Diagnosis not present

## 2019-02-08 DIAGNOSIS — Z8249 Family history of ischemic heart disease and other diseases of the circulatory system: Secondary | ICD-10-CM | POA: Diagnosis not present

## 2019-02-08 DIAGNOSIS — Z23 Encounter for immunization: Secondary | ICD-10-CM

## 2019-02-08 DIAGNOSIS — E7800 Pure hypercholesterolemia, unspecified: Secondary | ICD-10-CM

## 2019-02-08 DIAGNOSIS — F411 Generalized anxiety disorder: Secondary | ICD-10-CM | POA: Diagnosis not present

## 2019-02-08 DIAGNOSIS — E559 Vitamin D deficiency, unspecified: Secondary | ICD-10-CM

## 2019-02-08 DIAGNOSIS — I10 Essential (primary) hypertension: Secondary | ICD-10-CM | POA: Diagnosis not present

## 2019-02-08 DIAGNOSIS — N951 Menopausal and female climacteric states: Secondary | ICD-10-CM

## 2019-02-08 LAB — CBC WITH DIFFERENTIAL/PLATELET
Basophils Absolute: 0 10*3/uL (ref 0.0–0.1)
Basophils Relative: 0.4 % (ref 0.0–3.0)
Eosinophils Absolute: 0.1 10*3/uL (ref 0.0–0.7)
Eosinophils Relative: 2.4 % (ref 0.0–5.0)
HCT: 39.6 % (ref 36.0–46.0)
Hemoglobin: 13.2 g/dL (ref 12.0–15.0)
Lymphocytes Relative: 35.1 % (ref 12.0–46.0)
Lymphs Abs: 2 10*3/uL (ref 0.7–4.0)
MCHC: 33.3 g/dL (ref 30.0–36.0)
MCV: 92.1 fl (ref 78.0–100.0)
Monocytes Absolute: 0.6 10*3/uL (ref 0.1–1.0)
Monocytes Relative: 10.6 % (ref 3.0–12.0)
Neutro Abs: 2.9 10*3/uL (ref 1.4–7.7)
Neutrophils Relative %: 51.5 % (ref 43.0–77.0)
Platelets: 280 10*3/uL (ref 150.0–400.0)
RBC: 4.3 Mil/uL (ref 3.87–5.11)
RDW: 12.5 % (ref 11.5–15.5)
WBC: 5.6 10*3/uL (ref 4.0–10.5)

## 2019-02-08 LAB — COMPREHENSIVE METABOLIC PANEL
ALT: 12 U/L (ref 0–35)
AST: 15 U/L (ref 0–37)
Albumin: 4.6 g/dL (ref 3.5–5.2)
Alkaline Phosphatase: 52 U/L (ref 39–117)
BUN: 23 mg/dL (ref 6–23)
CO2: 28 mEq/L (ref 19–32)
Calcium: 9.6 mg/dL (ref 8.4–10.5)
Chloride: 102 mEq/L (ref 96–112)
Creatinine, Ser: 0.83 mg/dL (ref 0.40–1.20)
GFR: 71.59 mL/min (ref >60.00)
Glucose, Bld: 93 mg/dL (ref 70–99)
Potassium: 4.3 mEq/L (ref 3.5–5.1)
Sodium: 137 mEq/L (ref 135–145)
Total Bilirubin: 0.6 mg/dL (ref 0.2–1.2)
Total Protein: 7 g/dL (ref 6.0–8.3)

## 2019-02-08 LAB — LIPID PANEL
Cholesterol: 258 mg/dL — ABNORMAL HIGH (ref 0–200)
HDL: 63.5 mg/dL (ref >39.00)
LDL Cholesterol: 182 mg/dL — ABNORMAL HIGH (ref 0–99)
NonHDL: 194.46
Total CHOL/HDL Ratio: 4
Triglycerides: 61 mg/dL (ref 0.0–149.0)
VLDL: 12.2 mg/dL (ref 0.0–40.0)

## 2019-02-08 LAB — TSH: TSH: 3.73 u[IU]/mL (ref 0.35–4.50)

## 2019-02-08 LAB — VITAMIN D 25 HYDROXY (VIT D DEFICIENCY, FRACTURES): VITD: 26.48 ng/mL — ABNORMAL LOW (ref 30.00–100.00)

## 2019-02-08 MED ORDER — TIZANIDINE HCL 4 MG PO TABS: 4.0000 mg | ORAL_TABLET | Freq: Four times a day (QID) | ORAL | 0 refills | Status: DC | PRN

## 2019-02-08 MED ORDER — ESTRADIOL 0.05 MG/24HR TD PTTW: 1.0000 | MEDICATED_PATCH | TRANSDERMAL | 12 refills | Status: DC

## 2019-02-08 MED ORDER — BUSPIRONE HCL 5 MG PO TABS: 5.0000 mg | ORAL_TABLET | Freq: Three times a day (TID) | ORAL | 1 refills | Status: DC

## 2019-02-08 NOTE — Assessment & Plan Note (Signed)
Previously treated with Paxil,  Prozac, and lexapro.  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  Trial of buspirone

## 2019-02-08 NOTE — Progress Notes (Signed)
Subjective:  Patient ID: Gloria Antigua, MD, female    DOB: [birth year]  Age: 53 y.o. MRN: [id number]  CC: The primary encounter diagnosis was Fatigue, unspecified type. Diagnoses of Vitamin D deficiency, Pure hypercholesterolemia, Family history of early CAD, Essential hypertension, Menopause syndrome, History of tension headache, and Generalized anxiety disorder were also pertinent to this visit.  HPI Gloria Antigua, MD presents for evalauation and management of hypertension , anxiety, post menopausal syndrome and recent episode of sudden onset  severe headache .  History of headache:  Remote history of menstrual migraines  Which resolved after her  TAH/BSO.  No prior use of triptans. Recent headache occurred 6 days ago after driving to [place] to visit family.  Occurred after eating,  No heavy exertion prior to episode,  No alcohol the night before.  Started in the occiput bilaterally and spread to frontal area.  10/10 in severity.  No vision changes,  bp was 130/85.  Resolved with rest, dark room and NSAIDs.   Since then has noted pounding in temples but no temple or scalp pain.   Remote History of traumatic c6 fracture , has  DDD  with foraminal stenosis at multiple levels by 2018 MRI.   Has chronic neck pain with radiculopathy of right arm in ulnar distribution .  Receives regular massage.    Reviewed cardiac risk factors given strong FH of CAD.  Has untreated hyperlipidemia,  Untreated hypertension but started metoprolol 2 days ago. Coronary CT score zero in 2017  Stopped paxil and estrogen 6 months ago .  stoppe paxil due to loss of libido.  Anxiety worse without it.  Wakes up at 3 am most nights.   Stopped transdermal estrogen 6 months ago,  Vaginal dryness worse   Outpatient Medications Prior to Visit  Medication Sig Dispense Refill  . metoprolol succinate (TOPROL-XL) 25 MG 24 hr tablet Take 1 tablet (25 mg total) by mouth at bedtime. 90 tablet 3  . estradiol (VIVELLE-DOT)  0.05 MG/24HR patch Place 1 patch (0.05 mg total) onto the skin 2 (two) times a week. (Patient not taking: Reported on 02/08/2019) 8 patch 12  . PARoxetine (PAXIL) 10 MG tablet Take 1 tablet (10 mg total) by mouth daily. (Patient not taking: Reported on 02/08/2019) 90 tablet 1   No facility-administered medications prior to visit.     Review of Systems;  Patient denies, fevers, malaise, unintentional weight loss, skin rash, eye pain, sinus congestion and sinus pain, sore throat, dysphagia,  hemoptysis , cough, dyspnea, wheezing, chest pain, palpitations, orthopnea, edema, abdominal pain, nausea, melena, diarrhea, constipation, flank pain, dysuria, hematuria, urinary  Frequency, nocturia, numbness, tingling, seizures,  Focal weakness, Loss of consciousness,  Tremor,  depression,, and suicidal ideation.      Objective:  BP 130/86 (BP Location: Left Arm, Patient Position: Sitting, Cuff Size: Normal)   Pulse 67   Temp (!) 97.5 F (36.4 C) (Temporal)   Resp 14   Ht 5\' 5"  (1.651 m)   Wt 144 lb 12.8 oz (65.7 kg)   SpO2 98%   BMI 24.10 kg/m   BP Readings from Last 3 Encounters:  02/08/19 130/86  10/27/17 (!) 136/94  09/12/15 110/80    Wt Readings from Last 3 Encounters:  02/08/19 144 lb 12.8 oz (65.7 kg)  10/27/17 144 lb 6.4 oz (65.5 kg)  09/12/15 139 lb 4 oz (63.2 kg)    General appearance: alert, cooperative and appears stated age Ears: normal TM's and external ear canals  both ears Throat: lips, mucosa, and tongue normal; teeth and gums normal Neck: no adenopathy, no carotid bruit, supple, symmetrical, trachea midline and thyroid not enlarged, symmetric, no tenderness/mass/nodules Back: symmetric, no curvature. ROM normal. No CVA tenderness. Lungs: clear to auscultation bilaterally Heart: regular rate and rhythm, S1, S2 normal, no murmur, click, rub or gallop Abdomen: soft, non-tender; bowel sounds normal; no masses,  no organomegaly Pulses: 2+ and symmetric Skin: Skin color,  texture, turgor normal. No rashes or lesions Lymph nodes: Cervical, supraclavicular, and axillary nodes normal.  No results found for: HGBA1C  Lab Results  Component Value Date   CREATININE 0.81 10/27/2017   CREATININE 0.8 04/09/2015    Lab Results  Component Value Date   GLUCOSE 97 10/27/2017   CHOL 239 (H) 09/05/2015   TRIG 154 (H) 09/05/2015   HDL 78 09/05/2015   LDLDIRECT 167.0 10/27/2017   LDLCALC 130 (H) 09/05/2015   ALT 12 10/27/2017   AST 15 10/27/2017   NA 136 10/27/2017   K 3.9 10/27/2017   CL 99 10/27/2017   CREATININE 0.81 10/27/2017   BUN 19 10/27/2017   CO2 30 10/27/2017   TSH 4.03 10/27/2017   MICROALBUR <0.7 10/27/2017    Mm 3d Screen Breast Bilateral  Result Date: 12/01/2017 CLINICAL DATA:  Screening. EXAM: DIGITAL SCREENING BILATERAL MAMMOGRAM WITH TOMO AND CAD COMPARISON:  Previous exam(s). ACR Breast Density Category b: There are scattered areas of fibroglandular density. FINDINGS: There are no findings suspicious for malignancy. Images were processed with CAD. IMPRESSION: No mammographic evidence of malignancy. A result letter of this screening mammogram will be mailed directly to the patient. RECOMMENDATION: Screening mammogram in one year. (Code:SM-B-01Y) BI-RADS CATEGORY  1: Negative. Electronically Signed   By: Abelardo Diesel M.D.   On: 12/01/2017 10:39    Assessment & Plan:   Problem List Items Addressed This Visit      Unprioritized   Family history of early CAD    Cardiac ct was done and her score was zero.  her ten year risk of CAD being was low at 3% using FRC in 2017,  Repeat assessment today given new onset hypertension       Hyperlipidemia    Her ten year risk of CAD in 2017 ws 3% using the FRC.   Repeat screening today given new onset hypertension diagnosis       Relevant Orders   Lipid panel   VITAMIN D 25 Hydroxy (Vit-D Deficiency, Fractures)   Essential hypertension    Started Toprol XL 2 days ago.  Advised to take it at night .   Goal bp is 120/70, will need a 2nd agent if not at goal in one week       Menopause syndrome    Symptoms uncontrolled since stopping HRT and sleep is disrupted.  Restarting HRT       History of tension headache    Recent headache reviewed and appears to be related to neck tension.  Tizanidine and NSAID for next occurrence and MRI if recurrent. No signs of vasculitis on exam       Generalized anxiety disorder    Previously treated with Paxil,  Prozac, and lexapro.  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 I am also having her start on tiZANidine and busPIRone. Additionally, I am having her maintain her metoprolol succinate and estradiol.  Meds ordered this encounter  Medications  . tiZANidine (ZANAFLEX) 4 MG tablet    Sig: Take 1 tablet (4 mg total) by mouth every 6 (six) hours as needed for muscle spasms.    Dispense:  30 tablet    Refill:  0  . estradiol (VIVELLE-DOT) 0.05 MG/24HR patch    Sig: Place 1 patch (0.05 mg total) onto the skin 2 (two) times a week.    Dispense:  8 patch    Refill:  12  . busPIRone (BUSPAR) 5 MG tablet    Sig: Take 1 tablet (5 mg total) by mouth 3 (three) times daily.    Dispense:  90 tablet    Refill:  1    Medications Discontinued During This Encounter  Medication Reason  . estradiol (VIVELLE-DOT) 0.05 MG/24HR patch Patient has not taken in last 30 days  . PARoxetine (PAXIL) 10 MG tablet Patient has not taken in last 30 days    Follow-up: No follow-ups on file.   [name], MD

## 2019-02-08 NOTE — Assessment & Plan Note (Signed)
Cardiac ct was done and her score was zero.  her ten year risk of CAD being was low at 3% using FRC in 2017,  Repeat assessment today given new onset hypertension

## 2019-02-08 NOTE — Assessment & Plan Note (Signed)
Her ten year risk of CAD in 2017 ws 3% using the FRC.   Repeat screening today given new onset hypertension diagnosis

## 2019-02-08 NOTE — Assessment & Plan Note (Signed)
Recent headache reviewed and appears to be related to neck tension.  Tizanidine and NSAID for next occurrence and MRI if recurrent. No signs of vasculitis on exam

## 2019-02-08 NOTE — Assessment & Plan Note (Addendum)
Started Toprol XL 2 days ago.  Advised to take it at night .  Goal bp is 120/70, will need a 2nd agent if not at goal in one week

## 2019-02-08 NOTE — Assessment & Plan Note (Signed)
Symptoms uncontrolled since stopping HRT and sleep is disrupted.  Restarting HRT

## 2019-02-09 DIAGNOSIS — H5203 Hypermetropia, bilateral: Secondary | ICD-10-CM | POA: Diagnosis not present

## 2019-02-13 ENCOUNTER — Other Ambulatory Visit: Payer: Self-pay | Admitting: Internal Medicine

## 2019-02-13 ENCOUNTER — Encounter: Payer: Self-pay | Admitting: Internal Medicine

## 2019-02-13 DIAGNOSIS — F411 Generalized anxiety disorder: Secondary | ICD-10-CM

## 2019-02-13 DIAGNOSIS — G4459 Other complicated headache syndrome: Secondary | ICD-10-CM

## 2019-02-13 DIAGNOSIS — S12590D Other displaced fracture of sixth cervical vertebra, subsequent encounter for fracture with routine healing: Secondary | ICD-10-CM

## 2019-02-13 DIAGNOSIS — M5010 Cervical disc disorder with radiculopathy, unspecified cervical region: Secondary | ICD-10-CM

## 2019-02-16 ENCOUNTER — Ambulatory Visit
Admission: RE | Admit: 2019-02-16 | Discharge: 2019-02-16 | Disposition: A | Discharge status: Home / Self Care | Payer: 59 | Source: Ambulatory Visit | Attending: Internal Medicine | Admitting: Internal Medicine

## 2019-02-16 ENCOUNTER — Other Ambulatory Visit: Payer: Self-pay

## 2019-02-16 DIAGNOSIS — G4459 Other complicated headache syndrome: Secondary | ICD-10-CM | POA: Insufficient documentation

## 2019-02-16 DIAGNOSIS — S12590D Other displaced fracture of sixth cervical vertebra, subsequent encounter for fracture with routine healing: Secondary | ICD-10-CM | POA: Insufficient documentation

## 2019-02-16 DIAGNOSIS — R519 Headache, unspecified: Secondary | ICD-10-CM | POA: Diagnosis not present

## 2019-02-16 DIAGNOSIS — M50223 Other cervical disc displacement at C6-C7 level: Secondary | ICD-10-CM | POA: Diagnosis not present

## 2019-02-16 DIAGNOSIS — M5010 Cervical disc disorder with radiculopathy, unspecified cervical region: Secondary | ICD-10-CM | POA: Insufficient documentation

## 2019-02-16 MED ORDER — GADOBUTROL 1 MMOL/ML IV SOLN
6.0000 mL | Freq: Once | INTRAVENOUS | Status: AC | PRN
  Administered 2019-02-16: 6 mL via INTRAVENOUS

## 2019-07-17 ENCOUNTER — Other Ambulatory Visit: Payer: Self-pay | Admitting: Internal Medicine

## 2019-07-17 DIAGNOSIS — Z1231 Encounter for screening mammogram for malignant neoplasm of breast: Secondary | ICD-10-CM

## 2019-07-20 ENCOUNTER — Ambulatory Visit
Admission: RE | Admit: 2019-07-20 | Discharge: 2019-07-20 | Disposition: A | Discharge status: Home / Self Care | Payer: 59 | Source: Ambulatory Visit | Attending: Internal Medicine | Admitting: Internal Medicine

## 2019-07-20 DIAGNOSIS — Z1231 Encounter for screening mammogram for malignant neoplasm of breast: Secondary | ICD-10-CM | POA: Diagnosis not present

## 2019-08-02 ENCOUNTER — Telehealth: Payer: Self-pay

## 2019-08-02 ENCOUNTER — Other Ambulatory Visit: Payer: Self-pay | Admitting: Internal Medicine

## 2019-08-02 DIAGNOSIS — Z1211 Encounter for screening for malignant neoplasm of colon: Secondary | ICD-10-CM

## 2019-08-02 NOTE — Telephone Encounter (Signed)
Patient is ready to schedule her colonoscopy.  I've messaged her PCP to send referral.  LVM for patient to call office.  Thank you,  Sharyn Lull, CMA

## 2019-08-09 ENCOUNTER — Encounter: Payer: Self-pay | Admitting: *Deleted

## 2019-08-10 ENCOUNTER — Telehealth: Payer: Self-pay | Admitting: Internal Medicine

## 2019-08-10 NOTE — Telephone Encounter (Signed)
I called pt and left vm to call ofc regarding referral to [facility]

## 2020-02-15 ENCOUNTER — Ambulatory Visit: Payer: 59 | Attending: Internal Medicine

## 2020-04-22 ENCOUNTER — Other Ambulatory Visit: Payer: Self-pay | Admitting: Internal Medicine

## 2020-04-24 DIAGNOSIS — Z20822 Contact with and (suspected) exposure to covid-19: Secondary | ICD-10-CM | POA: Diagnosis not present

## 2020-05-15 ENCOUNTER — Other Ambulatory Visit: Payer: Self-pay | Admitting: Internal Medicine

## 2020-05-28 ENCOUNTER — Other Ambulatory Visit: Payer: Self-pay | Admitting: Internal Medicine

## 2020-05-28 DIAGNOSIS — Z1211 Encounter for screening for malignant neoplasm of colon: Secondary | ICD-10-CM

## 2020-06-12 DIAGNOSIS — Z1211 Encounter for screening for malignant neoplasm of colon: Secondary | ICD-10-CM | POA: Diagnosis not present

## 2020-06-18 LAB — COLOGUARD
COLOGUARD: NEGATIVE
Cologuard: NEGATIVE

## 2020-06-18 LAB — EXTERNAL GENERIC LAB PROCEDURE: COLOGUARD: NEGATIVE

## 2020-06-29 ENCOUNTER — Telehealth: Payer: Self-pay | Admitting: Internal Medicine

## 2020-06-29 LAB — COLOGUARD: Cologuard: NEGATIVE

## 2020-06-29 NOTE — Telephone Encounter (Signed)
.  MyChart message sent  Re negative cologuard

## 2020-08-07 ENCOUNTER — Other Ambulatory Visit: Payer: Self-pay

## 2020-08-07 DIAGNOSIS — B351 Tinea unguium: Secondary | ICD-10-CM | POA: Diagnosis not present

## 2020-08-07 DIAGNOSIS — L308 Other specified dermatitis: Secondary | ICD-10-CM | POA: Diagnosis not present

## 2020-08-07 DIAGNOSIS — L718 Other rosacea: Secondary | ICD-10-CM | POA: Diagnosis not present

## 2020-08-07 DIAGNOSIS — D235 Other benign neoplasm of skin of trunk: Secondary | ICD-10-CM | POA: Diagnosis not present

## 2020-08-07 MED ORDER — TRIAMCINOLONE ACETONIDE 0.1 % EX OINT
TOPICAL_OINTMENT | CUTANEOUS | 1 refills | Status: DC
  Filled 2020-08-07: qty 80, 30d supply, fill #0

## 2020-08-07 MED ORDER — IVERMECTIN 1 % EX CREA
TOPICAL_CREAM | CUTANEOUS | 1 refills | Status: DC
  Filled 2020-08-07 – 2020-08-28 (×2): qty 45, 30d supply, fill #0

## 2020-08-07 MED ORDER — FLUCONAZOLE 200 MG PO TABS
ORAL_TABLET | ORAL | 1 refills | Status: DC
  Filled 2020-08-07: qty 12, 84d supply, fill #0

## 2020-08-13 ENCOUNTER — Other Ambulatory Visit: Payer: Self-pay

## 2020-08-13 DIAGNOSIS — M7542 Impingement syndrome of left shoulder: Secondary | ICD-10-CM | POA: Diagnosis not present

## 2020-08-13 DIAGNOSIS — M542 Cervicalgia: Secondary | ICD-10-CM | POA: Diagnosis not present

## 2020-08-13 MED ORDER — MELOXICAM 15 MG PO TABS
1.0000 | ORAL_TABLET | Freq: Every day | ORAL | 1 refills | Status: DC | PRN
  Filled 2020-08-13: qty 30, 30d supply, fill #0

## 2020-08-26 ENCOUNTER — Other Ambulatory Visit: Payer: Self-pay

## 2020-08-28 ENCOUNTER — Other Ambulatory Visit: Payer: Self-pay

## 2020-08-28 MED FILL — Estradiol TD Patch Twice Weekly 0.05 MG/24HR: TRANSDERMAL | 84 days supply | Qty: 24 | Fill #0 | Status: AC

## 2020-09-03 ENCOUNTER — Other Ambulatory Visit: Payer: Self-pay

## 2020-09-09 ENCOUNTER — Other Ambulatory Visit: Payer: Self-pay

## 2020-09-09 MED ORDER — METRONIDAZOLE 0.75 % EX GEL
Freq: Two times a day (BID) | CUTANEOUS | 2 refills | Status: DC
  Filled 2020-09-09: qty 45, 30d supply, fill #0

## 2020-09-15 ENCOUNTER — Other Ambulatory Visit: Payer: Self-pay

## 2020-09-15 MED ORDER — CARESTART COVID-19 HOME TEST VI KIT
PACK | 0 refills | Status: DC
  Filled 2020-09-15: qty 2, 4d supply, fill #0

## 2020-09-18 ENCOUNTER — Telehealth: Payer: Self-pay | Admitting: Emergency Medicine

## 2020-09-18 MED ORDER — NIRMATRELVIR/RITONAVIR (PAXLOVID)TABLET
3.0000 | ORAL_TABLET | Freq: Two times a day (BID) | ORAL | 0 refills | Status: AC
  Filled 2020-09-18: qty 30, 5d supply, fill #0

## 2020-09-18 NOTE — Telephone Encounter (Signed)
Electronic Rx sent.

## 2020-09-19 ENCOUNTER — Other Ambulatory Visit: Payer: Self-pay

## 2020-09-26 ENCOUNTER — Other Ambulatory Visit: Payer: Self-pay

## 2020-10-28 ENCOUNTER — Other Ambulatory Visit: Payer: Self-pay

## 2020-10-28 MED FILL — Metoprolol Succinate Tab ER 24HR 25 MG (Tartrate Equiv): ORAL | 90 days supply | Qty: 90 | Fill #0 | Status: AC

## 2020-11-13 ENCOUNTER — Other Ambulatory Visit: Payer: Self-pay

## 2020-11-13 ENCOUNTER — Encounter: Payer: Self-pay | Admitting: Cardiovascular Disease

## 2020-11-13 ENCOUNTER — Ambulatory Visit (INDEPENDENT_AMBULATORY_CARE_PROVIDER_SITE_OTHER): Payer: 59 | Admitting: Cardiovascular Disease

## 2020-11-13 VITALS — BP 130/78 | HR 75 | Ht 65.0 in | Wt 146.0 lb

## 2020-11-13 DIAGNOSIS — I209 Angina pectoris, unspecified: Secondary | ICD-10-CM | POA: Diagnosis not present

## 2020-11-13 DIAGNOSIS — R030 Elevated blood-pressure reading, without diagnosis of hypertension: Secondary | ICD-10-CM | POA: Diagnosis not present

## 2020-11-13 DIAGNOSIS — E7849 Other hyperlipidemia: Secondary | ICD-10-CM | POA: Diagnosis not present

## 2020-11-13 DIAGNOSIS — R002 Palpitations: Secondary | ICD-10-CM | POA: Diagnosis not present

## 2020-11-13 MED ORDER — METOPROLOL TARTRATE 100 MG PO TABS
100.0000 mg | ORAL_TABLET | Freq: Once | ORAL | 0 refills | Status: DC
  Filled 2020-11-13 – 2020-12-08 (×2): qty 1, 1d supply, fill #0

## 2020-11-13 NOTE — Patient Instructions (Addendum)
Cardiac CTA as discussed  Medication Instructions:  No changes  If you need a refill on your cardiac medications before your next appointment, please call your pharmacy.    Lab work: No new labs needed   If you have labs (blood work) drawn today and your tests are completely normal, you will receive your results only by: [place] (if you have MyChart) OR A paper copy in the mail If you have any lab test that is abnormal or we need to change your treatment, we will call you to review the results.   Testing/Procedures:   Your cardiac CT will be scheduled at:  [facility] [address] [place], [place] [address] [phone]  Please arrive 15 mins early for check-in and test prep.   Please follow these instructions carefully (unless otherwise directed):    On the Night Before the Test: Be sure to Drink plenty of water. Do not consume any caffeinated/decaffeinated beverages or chocolate 12 hours prior to your test.    On the Day of the Test: Drink plenty of water until 1 hour prior to the test. Do not eat any food 4 hours prior to the test. You may take your regular medications prior to the test.  Take metoprolol (Lopressor) two hours prior to test.  (This was sent to your pharmacy) FEMALES- please wear underwire-free bra if available, avoid dresses & tight clothing    After the Test: Drink plenty of water. After receiving IV contrast, you may experience a mild flushed feeling. This is normal. On occasion, you may experience a mild rash up to 24 hours after the test. This is not dangerous. If this occurs, you can take Benadryl 25 mg and increase your fluid intake. If you experience trouble breathing, this can be serious. If it is severe call 911 IMMEDIATELY. If it is mild, please call our office. If you take any of these medications: Glipizide/Metformin, Avandament, Glucavance, please do not take 48 hours after  completing test unless otherwise instructed.  Please allow 2-4 weeks for scheduling of routine cardiac CTs. Some insurance companies require a pre-authorization which may delay scheduling of this test.   For non-scheduling related questions, please contact the cardiac imaging nurse navigator should you have any questions/concerns: [name], Cardiac Imaging Nurse Navigator Gordy Clement, Cardiac Imaging Nurse Navigator [facility] Direct Office Dial: [phone]   For scheduling needs, including cancellations and rescheduling, please call [place], [phone].    Follow-Up: At [facility], you and your health needs are our priority.  As part of our continuing mission to provide you with exceptional heart care, we have created designated Provider Care Teams.  These Care Teams include your primary Cardiologist (physician) and Advanced Practice Providers (APPs -  Physician Assistants and Nurse Practitioners) who all work together to provide you with the care you need, when you need it.  You will need a follow up appointment  as needed  Providers on your designated Care Team:   Murray Hodgkins, NP Christell Faith, PA-C Marrianne Mood, PA-C Cadence Kathlen Mody, [place]  Any Other Special Instructions Will Be Listed Below (If Applicable).  COVID-19 Vaccine Information can be found at: [email or web address].uk For questions related to vaccine distribution or appointments, please email [email or web address] or call [phone].

## 2020-11-13 NOTE — Progress Notes (Signed)
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 Meds reviewed verbally with patient.     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 with history of  hyperlipidemia, strong family history of coronary artery disease,  remote history of syncope felt secondary to orthostasis/vasovagal with stress test at that time   Presenting to the office for hyperlipidemia, strong family history coronary disease, tachycardia, other symptoms   Reports having high blood pressure in the clinic several years ago, Started On metoprolol succinate 25 daily 2 years Does not check her blood pressure or heart rate at home Significant stress at work, long hours, pediatrician  Tries to eat healthy, no regular exercise program Prior episodes of syncope reviewed  Lab work reviewed, prior LDL 180 Strong family history; brother with coronary disease, MI at 49, bypass, defibrillator. He was a smoker Father with coronary disease at 36, bypass surgery, stenting, PAD  EKG personally reviewed by myself on todays visit Shows normal sinus rhythm rate 75 bpm no significant ST-T wave changes    PMH:   has a past medical history of Cervical spine fracture (HCC), Closed wedge compression fracture of T6 vertebra with routine healing (09/07/2015), Menopause syndrome, and Traumatic closed fracture of C6 vertebra with minimal displacement () (09/07/2015).  PSH:    Past Surgical History:  Procedure Laterality Date   ABDOMINAL HYSTERECTOMY  2009   total   APPENDECTOMY  1982   BILATERAL SALPINGOOPHORECTOMY  2008    Current Outpatient Medications  Medication Sig Dispense Refill   estradiol (VIVELLE-DOT) 0.05 MG/24HR patch PLACE 1 PATCH ONTO THE SKIN 2 TIMES A WEEK. 24 patch 12   metoprolol succinate  (TOPROL-XL) 25 MG 24 hr tablet TAKE 1 TABLET BY MOUTH AT BEDTIME. 90 tablet 3   metoprolol tartrate (LOPRESSOR) 100 MG tablet Take 1 tablet (100 mg total) by mouth once for 1 dose. Take 2 hours prior to your CT scan. 1 tablet 0   busPIRone (BUSPAR) 5 MG tablet Take 1 tablet (5 mg total) by mouth 3 (three) times daily. (Patient not taking: Reported on 11/13/2020) 90 tablet 1   COVID-19 At Home Antigen Test (CARESTART COVID-19 HOME TEST) KIT use as directed within package instructions (Patient not taking: Reported on 11/13/2020) 2 kit 0   fluconazole (DIFLUCAN) 200 MG tablet Take one tablet by mouth weekly (Patient not taking: Reported on 11/13/2020) 12 tablet 1   Ivermectin 1 % CREA Apply once daily to face (Patient not taking: Reported on 11/13/2020) 45 g 1   meloxicam (MOBIC) 15 MG tablet Take 1 tablet by mouth once a day as needed (Patient not taking: Reported on 11/13/2020) 30 tablet 1   metroNIDAZOLE (METROGEL) 0.75 % gel Apply to face twice daily (Patient not taking: Reported on 11/13/2020) 45 g 2   tiZANidine (ZANAFLEX) 4 MG tablet Take 1 tablet (4 mg total) by mouth every 6 (six) hours as needed for muscle spasms. (Patient not taking: Reported on 11/13/2020) 30 tablet 0   triamcinolone ointment (KENALOG) 0.1 % Apply twice daily to hands until clear (Patient not taking: Reported on 11/13/2020) 80 g 1   No current facility-administered medications for this visit.     Allergies:   Patient has no known  allergies.   Social History:  The patient  reports that she quit smoking about 33 years ago. Her smoking use included cigarettes. She started smoking about 40 years ago. She has never used smokeless tobacco. She reports current alcohol use of about 7.0 standard drinks of alcohol per week. She reports that she does not use drugs.   Family History:   family history includes Cancer (age of onset: 45) in her mother; Heart attack (age of onset: 60) in her brother; Heart disease (age of onset: 5) in her  brother; Heart disease (age of onset: 55) in her father.    Review of Systems: Review of Systems  HENT: Negative.    Cardiovascular:  Positive for chest pain.    PHYSICAL EXAM: VS:  BP 130/78 (BP Location: Right Arm, Patient Position: Sitting, Cuff Size: Normal)   Pulse 75   Ht _0  (1.651 m)   Wt 146 lb (66.2 kg)   SpO2 98%   BMI 24.30 kg/m  , BMI Body mass index is 24.3 kg/m. GEN: Well nourished, well developed, in no acute distress HEENT: normal Neck: no JVD, carotid bruits, or masses Cardiac: RRR; no murmurs, rubs, or gallops,no edema  Respiratory:  clear to auscultation bilaterally, normal work of breathing GI: soft, nontender, nondistended, + BS MS: no deformity or atrophy Skin: warm and dry, no rash Neuro:  Strength and sensation are intact Psych: euthymic mood, full affect   Recent Labs: No results found for requested labs within last 8760 hours.    Lipid Panel Lab Results  Component Value Date   CHOL 258 (H) 02/08/2019   HDL 63.50 02/08/2019   LDLCALC 182 (H) 02/08/2019   TRIG 61.0 02/08/2019      Wt Readings from Last 3 Encounters:  11/13/20 146 lb (66.2 kg)  02/08/19 144 lb 12.8 oz (65.7 kg)  10/27/17 144 lb 6.4 oz (65.5 kg)      ASSESSMENT AND PLAN:  Problem List Items Addressed This Visit   None Visit Diagnoses     Angina pectoris (HCC)    -  Primary   Relevant Medications   metoprolol tartrate (LOPRESSOR) 100 MG tablet   Other Relevant Orders   EKG 12-Lead   Vitamin D 1,25 dihydroxy   Basic metabolic panel   CT CORONARY MORPH W/CTA COR W/SCORE W/CA W/CM &/OR WO/CM   Lipid panel   Familial hyperlipidemia       Relevant Medications   metoprolol tartrate (LOPRESSOR) 100 MG tablet   Palpitations       Elevated blood pressure reading          Long discussion concerning symptoms,  In light of very strong family history coronary disease, Need to make sure chest tightness, palpitations is not secondary to unstable angina Discussed  various treatment options, would highly recommend cardiac CTA Risk and benefit discussed, she is willing to proceed  Hyperlipidemia Markedly elevated LDL 180, repeat lab order placed today Has not eaten for 6 hours Will use cardiac CTA to help guide cholesterol management  Elevated blood pressure, palpitations On metoprolol succinate 25 daily Would recommend monitoring of heart rate and blood pressure at home to guide whether we can decrease the dose of metoprolol    Total encounter time more than 25 minutes  Greater than 50% was spent in counseling and coordination of care with the patient    Signed, Esmond Plants, M.D., Ph.D. [place], [place]

## 2020-11-21 LAB — BASIC METABOLIC PANEL
BUN/Creatinine Ratio: 18 (ref 9–23)
BUN: 15 mg/dL (ref 6–24)
CO2: 22 mmol/L (ref 20–29)
Calcium: 9.3 mg/dL (ref 8.7–10.2)
Chloride: 97 mmol/L (ref 96–106)
Creatinine, Ser: 0.82 mg/dL (ref 0.57–1.00)
Glucose: 88 mg/dL (ref 65–99)
Potassium: 4.3 mmol/L (ref 3.5–5.2)
Sodium: 135 mmol/L (ref 134–144)
eGFR: 84 mL/min/{1.73_m2} (ref >59)

## 2020-11-21 LAB — VITAMIN D 1,25 DIHYDROXY
Vitamin D 1, 25 (OH)2 Total: 57 pg/mL
Vitamin D2 1, 25 (OH)2: <10 pg/mL
Vitamin D3 1, 25 (OH)2: 57 pg/mL

## 2020-11-21 LAB — LIPID PANEL
Chol/HDL Ratio: 3.6 ratio (ref 0.0–4.4)
Cholesterol, Total: 286 mg/dL — ABNORMAL HIGH (ref 100–199)
HDL: 79 mg/dL (ref >39)
LDL Chol Calc (NIH): 194 mg/dL — ABNORMAL HIGH (ref 0–99)
Triglycerides: 83 mg/dL (ref 0–149)
VLDL Cholesterol Cal: 13 mg/dL (ref 5–40)

## 2020-11-25 ENCOUNTER — Other Ambulatory Visit: Payer: Self-pay

## 2020-11-27 ENCOUNTER — Ambulatory Visit: Admission: RE | Admit: 2020-11-27 | Payer: 59 | Source: Ambulatory Visit

## 2020-12-08 ENCOUNTER — Other Ambulatory Visit: Payer: Self-pay

## 2020-12-08 MED FILL — Metoprolol Succinate Tab ER 24HR 25 MG (Tartrate Equiv): ORAL | 90 days supply | Qty: 90 | Fill #1 | Status: CN

## 2020-12-08 MED FILL — Estradiol TD Patch Twice Weekly 0.05 MG/24HR: TRANSDERMAL | 84 days supply | Qty: 24 | Fill #1 | Status: AC

## 2020-12-10 ENCOUNTER — Telehealth (HOSPITAL_COMMUNITY): Payer: Self-pay | Admitting: Emergency Medicine

## 2020-12-10 ENCOUNTER — Encounter (HOSPITAL_COMMUNITY): Payer: Self-pay | Admitting: Emergency Medicine

## 2020-12-10 NOTE — Telephone Encounter (Signed)
Reaching out to patient to offer assistance regarding upcoming cardiac imaging study; pt verbalizes understanding of appt date/time, parking situation and where to check in, pre-test NPO status and medications ordered, and verified current allergies; name and call back number provided for further questions should they arise Gloria Bond RN [facility] [phone] office [phone] cell   Denies claustro Denies iv issues '100mg'$  metoprolol tart

## 2020-12-11 ENCOUNTER — Ambulatory Visit
Admission: RE | Admit: 2020-12-11 | Discharge: 2020-12-11 | Disposition: A | Discharge status: Home / Self Care | Payer: 59 | Source: Ambulatory Visit | Attending: Cardiovascular Disease | Admitting: Cardiovascular Disease

## 2020-12-11 ENCOUNTER — Other Ambulatory Visit: Payer: Self-pay

## 2020-12-11 DIAGNOSIS — I209 Angina pectoris, unspecified: Secondary | ICD-10-CM | POA: Insufficient documentation

## 2020-12-11 MED ORDER — IOHEXOL 350 MG/ML SOLN
75.0000 mL | Freq: Once | INTRAVENOUS | Status: AC | PRN
  Administered 2020-12-11: 75 mL via INTRAVENOUS

## 2020-12-11 MED ORDER — METOPROLOL TARTRATE 5 MG/5ML IV SOLN
10.0000 mg | Freq: Once | INTRAVENOUS | Status: AC
  Administered 2020-12-11: 10 mg via INTRAVENOUS

## 2020-12-11 MED ORDER — NITROGLYCERIN 0.4 MG SL SUBL
0.8000 mg | SUBLINGUAL_TABLET | Freq: Once | SUBLINGUAL | Status: AC
  Administered 2020-12-11: 0.8 mg via SUBLINGUAL

## 2020-12-11 NOTE — Progress Notes (Signed)
Patient tolerated CT well. Gave patient coffee and water after. Vital signs stable encourage to drink water throughout day. Reasons explained and verbalized understanding. Ambulated steady gait.

## 2020-12-12 ENCOUNTER — Telehealth: Payer: Self-pay

## 2020-12-12 NOTE — Telephone Encounter (Signed)
LMOV to schedule follow up 

## 2020-12-12 NOTE — Telephone Encounter (Signed)
Spoke with patients husband and transferred him to Dr. Garen Lah for details about patients CCTA scan. Per their conversation, patient can follow up with Dr. Rockey Situ at a later date, at patients preference, preferably on a Thursday.  Will route to scheduling to schedule patient.

## 2020-12-12 NOTE — Telephone Encounter (Signed)
Spoke to Dr. Garen Lah and he stated that the it was NOT intramural in the aortic wall.  Called the patient and informed her of this.  She was grateful for the call back.

## 2020-12-12 NOTE — Telephone Encounter (Signed)
Patient called and had a question for Dr. Launa Grill she wanted me to pass on. She stated that her brother-in-law is a cardiologist and wanted to know if the abnormality was "intramural in the aortic wall".  Will route to Dr. Garen Lah for review.

## 2020-12-12 NOTE — Telephone Encounter (Signed)
Per Dr. Launa Grill, I called patient and left a VM requesting a call back as we would like to see patient on Monday 12/15/20 regarding her CCTA results. We have held a 3:20 appointment with Dr. Rockey Situ. I informed her this is regarding her CCTA scan.   Also left a VM for patients husband per DPR on file requesting a call back to confirm apt for Monday at 3:20.

## 2020-12-15 ENCOUNTER — Ambulatory Visit: Payer: 59 | Admitting: Cardiovascular Disease

## 2020-12-18 ENCOUNTER — Other Ambulatory Visit: Payer: Self-pay

## 2020-12-18 DIAGNOSIS — J019 Acute sinusitis, unspecified: Secondary | ICD-10-CM | POA: Diagnosis not present

## 2020-12-18 DIAGNOSIS — J029 Acute pharyngitis, unspecified: Secondary | ICD-10-CM | POA: Diagnosis not present

## 2020-12-18 MED ORDER — PREDNISONE 10 MG PO TABS
ORAL_TABLET | ORAL | 0 refills | Status: DC
  Filled 2020-12-18: qty 48, 12d supply, fill #0

## 2020-12-18 MED ORDER — LEVOFLOXACIN 750 MG PO TABS
ORAL_TABLET | ORAL | 0 refills | Status: DC
  Filled 2020-12-18: qty 14, 14d supply, fill #0

## 2020-12-18 MED ORDER — OXYMETAZOLINE HCL 0.05 % NA SOLN: NASAL | 0 refills | Status: DC

## 2021-01-01 ENCOUNTER — Other Ambulatory Visit: Payer: Self-pay

## 2021-01-01 MED ORDER — LEVOFLOXACIN 750 MG PO TABS
ORAL_TABLET | ORAL | 0 refills | Status: DC
  Filled 2021-01-01: qty 14, 14d supply, fill #0

## 2021-01-02 ENCOUNTER — Other Ambulatory Visit: Payer: Self-pay

## 2021-01-06 ENCOUNTER — Other Ambulatory Visit: Payer: Self-pay | Admitting: Internal Medicine

## 2021-01-19 ENCOUNTER — Other Ambulatory Visit: Payer: Self-pay

## 2021-01-19 MED ORDER — DOXYCYCLINE HYCLATE 100 MG PO TABS
ORAL_TABLET | ORAL | 0 refills | Status: DC
  Filled 2021-01-19: qty 42, 21d supply, fill #0

## 2021-02-10 ENCOUNTER — Other Ambulatory Visit: Payer: Self-pay

## 2021-02-10 MED FILL — Metoprolol Succinate Tab ER 24HR 25 MG (Tartrate Equiv): ORAL | 90 days supply | Qty: 90 | Fill #1 | Status: AC

## 2021-03-05 DIAGNOSIS — J301 Allergic rhinitis due to pollen: Secondary | ICD-10-CM | POA: Diagnosis not present

## 2021-03-17 DIAGNOSIS — R059 Cough, unspecified: Secondary | ICD-10-CM | POA: Diagnosis not present

## 2021-04-09 ENCOUNTER — Other Ambulatory Visit: Payer: Self-pay

## 2021-04-09 MED ORDER — MONTELUKAST SODIUM 10 MG PO TABS
10.0000 mg | ORAL_TABLET | Freq: Every day | ORAL | 3 refills | Status: DC
  Filled 2021-04-09: qty 90, 90d supply, fill #0

## 2021-04-10 ENCOUNTER — Other Ambulatory Visit: Payer: Self-pay

## 2021-05-21 ENCOUNTER — Other Ambulatory Visit: Payer: Self-pay

## 2021-05-21 ENCOUNTER — Other Ambulatory Visit: Payer: Self-pay | Admitting: Internal Medicine

## 2021-05-21 MED ORDER — ESTRADIOL 0.05 MG/24HR TD PTTW
MEDICATED_PATCH | TRANSDERMAL | 2 refills | Status: DC
  Filled 2021-05-21: qty 24, 84d supply, fill #0
  Filled 2021-08-21: qty 24, 84d supply, fill #1
  Filled 2021-12-09: qty 24, 84d supply, fill #2

## 2021-05-21 MED ORDER — METOPROLOL SUCCINATE ER 25 MG PO TB24
ORAL_TABLET | Freq: Every day | ORAL | 0 refills | Status: DC
  Filled 2021-05-21: qty 90, 90d supply, fill #0

## 2021-08-21 ENCOUNTER — Other Ambulatory Visit: Payer: Self-pay

## 2021-08-21 ENCOUNTER — Other Ambulatory Visit: Payer: Self-pay | Admitting: Internal Medicine

## 2021-08-21 MED FILL — Metoprolol Succinate Tab ER 24HR 25 MG (Tartrate Equiv): ORAL | 90 days supply | Qty: 90 | Fill #0 | Status: AC

## 2021-08-24 ENCOUNTER — Other Ambulatory Visit: Payer: Self-pay

## 2021-09-21 NOTE — Telephone Encounter (Signed)
Attempted to schedule.  

## 2021-10-26 ENCOUNTER — Other Ambulatory Visit: Payer: Self-pay | Admitting: Internal Medicine

## 2021-10-26 DIAGNOSIS — Z1231 Encounter for screening mammogram for malignant neoplasm of breast: Secondary | ICD-10-CM

## 2021-11-20 ENCOUNTER — Ambulatory Visit
Admission: RE | Admit: 2021-11-20 | Discharge: 2021-11-20 | Disposition: A | Discharge status: Home / Self Care | Payer: 59 | Source: Ambulatory Visit | Attending: Internal Medicine | Admitting: Internal Medicine

## 2021-11-20 DIAGNOSIS — Z1231 Encounter for screening mammogram for malignant neoplasm of breast: Secondary | ICD-10-CM | POA: Diagnosis not present

## 2021-12-09 ENCOUNTER — Other Ambulatory Visit: Payer: Self-pay | Admitting: Internal Medicine

## 2021-12-09 ENCOUNTER — Other Ambulatory Visit: Payer: Self-pay

## 2021-12-09 MED ORDER — METOPROLOL SUCCINATE ER 25 MG PO TB24
ORAL_TABLET | Freq: Every day | ORAL | 0 refills | Status: DC
  Filled 2021-12-09: qty 90, 90d supply, fill #0

## 2021-12-10 ENCOUNTER — Other Ambulatory Visit: Payer: Self-pay

## 2022-02-25 ENCOUNTER — Other Ambulatory Visit: Payer: Self-pay

## 2022-02-25 MED ORDER — CEFPODOXIME PROXETIL 200 MG PO TABS
ORAL_TABLET | ORAL | 0 refills | Status: DC
  Filled 2022-02-25: qty 60, 60d supply, fill #0

## 2022-02-26 ENCOUNTER — Other Ambulatory Visit: Payer: Self-pay

## 2022-03-08 ENCOUNTER — Other Ambulatory Visit: Payer: Self-pay

## 2022-03-08 ENCOUNTER — Other Ambulatory Visit: Payer: Self-pay | Admitting: Internal Medicine

## 2022-03-08 DIAGNOSIS — Z23 Encounter for immunization: Secondary | ICD-10-CM | POA: Diagnosis not present

## 2022-03-08 DIAGNOSIS — H524 Presbyopia: Secondary | ICD-10-CM | POA: Diagnosis not present

## 2022-03-08 MED FILL — Estradiol TD Patch Twice Weekly 0.05 MG/24HR: TRANSDERMAL | 84 days supply | Qty: 24 | Fill #0 | Status: AC

## 2022-03-29 ENCOUNTER — Other Ambulatory Visit: Payer: Self-pay | Admitting: Internal Medicine

## 2022-03-29 ENCOUNTER — Other Ambulatory Visit: Payer: Self-pay

## 2022-03-29 DIAGNOSIS — F338 Other recurrent depressive disorders: Secondary | ICD-10-CM

## 2022-03-29 MED ORDER — FLUOXETINE HCL 10 MG PO CAPS
10.0000 mg | ORAL_CAPSULE | Freq: Every day | ORAL | 1 refills | Status: DC
  Filled 2022-03-29: qty 90, 90d supply, fill #0
  Filled 2022-07-05: qty 90, 90d supply, fill #1

## 2022-03-29 NOTE — Assessment & Plan Note (Signed)
Starting prozac at 10 mg daily

## 2022-04-29 ENCOUNTER — Other Ambulatory Visit: Payer: Self-pay

## 2022-04-29 ENCOUNTER — Other Ambulatory Visit: Payer: Self-pay | Admitting: Internal Medicine

## 2022-04-29 MED FILL — Metoprolol Succinate Tab ER 24HR 25 MG (Tartrate Equiv): ORAL | 81 days supply | Qty: 81 | Fill #0 | Status: AC

## 2022-04-29 MED FILL — Metoprolol Succinate Tab ER 24HR 25 MG (Tartrate Equiv): ORAL | 9 days supply | Qty: 9 | Fill #0 | Status: AC

## 2022-04-29 NOTE — Telephone Encounter (Signed)
Refilled: 12/09/2021 Last OV: 02/08/2019 Next OV: not scheduled

## 2022-06-02 ENCOUNTER — Other Ambulatory Visit: Payer: Self-pay

## 2022-06-02 MED FILL — Estradiol TD Patch Twice Weekly 0.05 MG/24HR: TRANSDERMAL | 84 days supply | Qty: 24 | Fill #1 | Status: AC

## 2022-07-05 ENCOUNTER — Other Ambulatory Visit: Payer: Self-pay

## 2022-08-25 ENCOUNTER — Other Ambulatory Visit: Payer: Self-pay

## 2022-08-25 ENCOUNTER — Other Ambulatory Visit: Payer: Self-pay | Admitting: Internal Medicine

## 2022-08-25 MED FILL — Estradiol TD Patch Twice Weekly 0.05 MG/24HR: TRANSDERMAL | 84 days supply | Qty: 24 | Fill #2 | Status: AC

## 2022-08-26 ENCOUNTER — Other Ambulatory Visit: Payer: Self-pay

## 2022-08-26 ENCOUNTER — Other Ambulatory Visit: Payer: Self-pay | Admitting: Internal Medicine

## 2022-08-26 NOTE — Telephone Encounter (Signed)
Refilled: 04/29/2022 Last OV: 02/08/2019 Next OV: not scheduled

## 2022-08-27 ENCOUNTER — Other Ambulatory Visit: Payer: Self-pay

## 2022-08-27 MED FILL — Metoprolol Succinate Tab ER 24HR 25 MG (Tartrate Equiv): ORAL | 90 days supply | Qty: 90 | Fill #0 | Status: AC

## 2022-10-06 ENCOUNTER — Other Ambulatory Visit: Payer: Self-pay | Admitting: Internal Medicine

## 2022-10-06 ENCOUNTER — Other Ambulatory Visit: Payer: Self-pay

## 2022-10-07 ENCOUNTER — Other Ambulatory Visit: Payer: Self-pay

## 2022-10-07 ENCOUNTER — Other Ambulatory Visit: Payer: Self-pay | Admitting: Internal Medicine

## 2022-10-08 ENCOUNTER — Other Ambulatory Visit: Payer: Self-pay

## 2022-10-08 MED FILL — Fluoxetine HCl Cap 10 MG: ORAL | 90 days supply | Qty: 90 | Fill #0 | Status: AC

## 2022-10-28 ENCOUNTER — Telehealth: Payer: Self-pay | Admitting: Internal Medicine

## 2022-10-28 NOTE — Telephone Encounter (Signed)
Pt called in stating to make a physical with provider. However, last OV with [facility] was back in 2020. Pt stated she will reach out to Dr. Darrick Huntsman and will call back.

## 2022-10-28 NOTE — Telephone Encounter (Signed)
Is it okay to still schedule pt?

## 2022-10-29 ENCOUNTER — Telehealth: Payer: Self-pay | Admitting: Emergency Medicine

## 2022-10-29 DIAGNOSIS — R072 Precordial pain: Secondary | ICD-10-CM

## 2022-10-29 NOTE — Telephone Encounter (Signed)
Called and spoke to the patient. Informed patient of the following recommendation from Dr. Mariah Milling.  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 This will take place at [facility]. Please report to the [facility] medical mall entrance. The volunteers at the first desk will direct you where to go.  [facility] MYOVIEW  Your provider has ordered a Stress Test with nuclear imaging. The purpose of this test is to evaluate the blood supply to your heart muscle. This procedure is referred to as a "Non-Invasive Stress Test." This is because other than having an IV started in your vein, nothing is inserted or "invades" your body. Cardiac stress tests are done to find areas of poor blood flow to the heart by determining the extent of coronary artery disease (CAD). Some patients exercise on a treadmill, which naturally increases the blood flow to your heart, while others who are unable to walk on a treadmill due to physical limitations will have a pharmacologic/chemical stress agent called Lexiscan . This medicine will mimic walking on a treadmill by temporarily increasing your coronary blood flow.   Please note: these test may take anywhere between 2-4 hours to complete  How to prepare for your Myoview test:  Nothing to eat for 6 hours prior to the test No caffeine for 24 hours prior to test No smoking 24 hours prior to test. Please hold Metoprolol for 24 hours prior to test.  Ladies, please do not wear dresses.  Skirts or pants are appropriate. Please wear a short sleeve shirt. No perfume, cologne or lotion. Wear comfortable walking shoes. No heels!   PLEASE NOTIFY THE OFFICE AT LEAST 24 HOURS IN ADVANCE IF YOU ARE UNABLE TO KEEP YOUR  APPOINTMENT.  [phone] AND  PLEASE NOTIFY NUCLEAR MEDICINE AT [facility] AT LEAST 24 HOURS IN ADVANCE IF YOU ARE UNABLE TO KEEP YOUR APPOINTMENT. [phone]

## 2022-10-29 NOTE — Telephone Encounter (Signed)
Spoke with pt and scheduled her for a physical per Dr. Melina Schools okay.

## 2022-10-29 NOTE — Telephone Encounter (Signed)
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

## 2022-11-25 ENCOUNTER — Other Ambulatory Visit: Payer: Self-pay

## 2022-11-25 ENCOUNTER — Ambulatory Visit (INDEPENDENT_AMBULATORY_CARE_PROVIDER_SITE_OTHER): Payer: 59 | Admitting: Internal Medicine

## 2022-11-25 ENCOUNTER — Encounter: Payer: Self-pay | Admitting: Internal Medicine

## 2022-11-25 VITALS — BP 128/78 | HR 59 | Ht 65.0 in | Wt 147.4 lb

## 2022-11-25 DIAGNOSIS — Z0000 Encounter for general adult medical examination without abnormal findings: Secondary | ICD-10-CM

## 2022-11-25 DIAGNOSIS — F338 Other recurrent depressive disorders: Secondary | ICD-10-CM | POA: Diagnosis not present

## 2022-11-25 DIAGNOSIS — R5383 Other fatigue: Secondary | ICD-10-CM | POA: Diagnosis not present

## 2022-11-25 DIAGNOSIS — I10 Essential (primary) hypertension: Secondary | ICD-10-CM | POA: Diagnosis not present

## 2022-11-25 DIAGNOSIS — N951 Menopausal and female climacteric states: Secondary | ICD-10-CM

## 2022-11-25 DIAGNOSIS — F411 Generalized anxiety disorder: Secondary | ICD-10-CM

## 2022-11-25 DIAGNOSIS — R7989 Other specified abnormal findings of blood chemistry: Secondary | ICD-10-CM

## 2022-11-25 DIAGNOSIS — Z8249 Family history of ischemic heart disease and other diseases of the circulatory system: Secondary | ICD-10-CM

## 2022-11-25 DIAGNOSIS — E785 Hyperlipidemia, unspecified: Secondary | ICD-10-CM

## 2022-11-25 DIAGNOSIS — Z1231 Encounter for screening mammogram for malignant neoplasm of breast: Secondary | ICD-10-CM

## 2022-11-25 LAB — CBC WITH DIFFERENTIAL/PLATELET
Basophils Absolute: 0 10*3/uL (ref 0.0–0.1)
Basophils Relative: 0.4 % (ref 0.0–3.0)
Eosinophils Absolute: 0.1 10*3/uL (ref 0.0–0.7)
Eosinophils Relative: 2.1 % (ref 0.0–5.0)
HCT: 40.4 % (ref 36.0–46.0)
Hemoglobin: 13.2 g/dL (ref 12.0–15.0)
Lymphocytes Relative: 41.4 % (ref 12.0–46.0)
Lymphs Abs: 2.2 10*3/uL (ref 0.7–4.0)
MCHC: 32.6 g/dL (ref 30.0–36.0)
MCV: 94.7 fl (ref 78.0–100.0)
Monocytes Absolute: 0.5 10*3/uL (ref 0.1–1.0)
Monocytes Relative: 10.1 % (ref 3.0–12.0)
Neutro Abs: 2.4 10*3/uL (ref 1.4–7.7)
Neutrophils Relative %: 46 % (ref 43.0–77.0)
Platelets: 304 10*3/uL (ref 150.0–400.0)
RBC: 4.27 Mil/uL (ref 3.87–5.11)
RDW: 12.9 % (ref 11.5–15.5)
WBC: 5.3 10*3/uL (ref 4.0–10.5)

## 2022-11-25 LAB — LIPID PANEL
Cholesterol: 294 mg/dL — ABNORMAL HIGH (ref 0–200)
HDL: 72 mg/dL (ref >39.00)
LDL Cholesterol: 205 mg/dL — ABNORMAL HIGH (ref 0–99)
NonHDL: 222.13
Total CHOL/HDL Ratio: 4
Triglycerides: 84 mg/dL (ref 0.0–149.0)
VLDL: 16.8 mg/dL (ref 0.0–40.0)

## 2022-11-25 LAB — COMPREHENSIVE METABOLIC PANEL
ALT: 15 U/L (ref 0–35)
AST: 19 U/L (ref 0–37)
Albumin: 4.6 g/dL (ref 3.5–5.2)
Alkaline Phosphatase: 46 U/L (ref 39–117)
BUN: 14 mg/dL (ref 6–23)
CO2: 28 mEq/L (ref 19–32)
Calcium: 9.6 mg/dL (ref 8.4–10.5)
Chloride: 99 mEq/L (ref 96–112)
Creatinine, Ser: 0.83 mg/dL (ref 0.40–1.20)
GFR: 77.95 mL/min (ref >60.00)
Glucose, Bld: 93 mg/dL (ref 70–99)
Potassium: 4.2 mEq/L (ref 3.5–5.1)
Sodium: 134 mEq/L — ABNORMAL LOW (ref 135–145)
Total Bilirubin: 0.7 mg/dL (ref 0.2–1.2)
Total Protein: 7.3 g/dL (ref 6.0–8.3)

## 2022-11-25 LAB — TSH: TSH: 7.78 u[IU]/mL — ABNORMAL HIGH (ref 0.35–5.50)

## 2022-11-25 LAB — B12 AND FOLATE PANEL
Folate: 12.2 ng/mL (ref >5.9)
Vitamin B-12: 655 pg/mL (ref 211–911)

## 2022-11-25 LAB — LDL CHOLESTEROL, DIRECT: Direct LDL: 193 mg/dL

## 2022-11-25 LAB — MICROALBUMIN / CREATININE URINE RATIO
Creatinine,U: 30 mg/dL
Microalb Creat Ratio: 2.3 mg/g (ref 0.0–30.0)
Microalb, Ur: <0.7 mg/dL (ref 0.0–1.9)

## 2022-11-25 LAB — HEMOGLOBIN A1C: Hgb A1c MFr Bld: 5.2 % (ref 4.6–6.5)

## 2022-11-25 MED ORDER — ESTRADIOL 0.05 MG/24HR TD PTTW
MEDICATED_PATCH | TRANSDERMAL | 2 refills | Status: DC
  Filled 2022-11-25: qty 24, 84d supply, fill #0
  Filled 2022-11-26: qty 24, 90d supply, fill #0
  Filled 2022-12-09: qty 24, 84d supply, fill #0
  Filled 2023-03-10: qty 24, 84d supply, fill #1
  Filled 2023-06-20: qty 24, 84d supply, fill #2

## 2022-11-25 MED ORDER — METOPROLOL SUCCINATE ER 25 MG PO TB24
25.0000 mg | ORAL_TABLET | Freq: Every day | ORAL | 0 refills | Status: DC
  Filled 2022-11-25 – 2022-12-09 (×2): qty 90, 90d supply, fill #0

## 2022-11-25 NOTE — Assessment & Plan Note (Signed)
Symptoms resolved with  resuming HRT.  She has no contraindications

## 2022-11-25 NOTE — Assessment & Plan Note (Signed)
Her coronary calcium score is zero.  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

## 2022-11-25 NOTE — Assessment & Plan Note (Signed)
Intensified during COVID pandemic.  Controlled with minimal Prozac dose.  Continue medication per patient preference

## 2022-11-25 NOTE — Assessment & Plan Note (Signed)
Continue prozac at 10 mg daily

## 2022-11-25 NOTE — Progress Notes (Addendum)
Patient ID: Jairo Ben, MD, female    DOB: January 03, 1966  Age: 57 y.o. MRN: [id number]  The patient is here for annual preventive examination and management of other chronic and acute problems.   The risk factors are reflected in the social history.   The roster of all physicians providing medical care to patient - is listed in the Snapshot section of the chart.   Activities of daily living:  The patient is 100% independent in all ADLs: dressing, toileting, feeding as well as independent mobility   Home safety : The patient has smoke detectors in the home. They wear seatbelts.  There are no unsecured firearms at home. There is no violence in the home.    There is no risks for hepatitis, STDs or HIV. There is no   history of blood transfusion. They have no travel history to infectious disease endemic areas of the world.   The patient has seen their dentist in the last six month. They have seen their eye doctor in the last year. The patinet  denies slight hearing difficulty with regard to whispered voices and some television programs.  They have deferred audiologic testing in the last year.  They do not  have excessive sun exposure. Discussed the need for sun protection: hats, long sleeves and use of sunscreen if there is significant sun exposure.    Diet: the importance of a healthy diet is discussed. Dr Jenne Campus  has a healthy diet.   The benefits of regular aerobic exercise were discussed. The patient  exercises  3 to 5 days per week  for  60 minutes.    Depression screen: there are no signs or vegative symptoms of depression- irritability, change in appetite, anhedonia, sadness/tearfullness.   The following portions of the patient's history were reviewed and updated as appropriate: allergies, current medications, past family history, past medical history,  past surgical history, past social history  and problem list.   Visual acuity was not assessed per patient preference since the patient  has regular follow up with an  ophthalmologist. Hearing and body mass index were assessed and reviewed.    During the course of the visit the patient was educated and counseled about appropriate screening and preventive services including : fall prevention , diabetes screening, nutrition counseling, colorectal cancer screening, and recommended immunizations.    Chief Complaint:  No  new or unresolved issues    1) HTN:  has been taking Toprol XL 25 mg at bedtime for HTN , nocturnal anxiety.  Discussed weaning and starting an alternative to allow exercise induced tachycardia   2) coronary anomaly: found during workup for chest pain.  Has seen cardiothoracic surgeon , no surgery advised (due to risk) unless she becomes symptomatic.    3) HRT:  has been using transdermal estrogen since her TAH/BSO (9 years).    Review of Symptoms  Patient denies headache, fevers, malaise, unintentional weight loss, skin rash, eye pain, sinus congestion and sinus pain, sore throat, dysphagia,  hemoptysis , cough, dyspnea, wheezing, chest pain, palpitations, orthopnea, edema, abdominal pain, nausea, melena, diarrhea, constipation, flank pain, dysuria, hematuria, urinary  Frequency, nocturia, numbness, tingling, seizures,  Focal weakness, Loss of consciousness,  Tremor, insomnia, depression, anxiety, and suicidal ideation.    Physical Exam:  BP 128/78   Pulse (!) 59   Ht 5\' 5"  (1.651 m)   Wt 147 lb 6.4 oz (66.9 kg)   SpO2 97%   BMI 24.53 kg/m    Physical Exam Vitals  reviewed.  Constitutional:      General: She is not in acute distress.    Appearance: Normal appearance. She is normal weight. She is not ill-appearing, toxic-appearing or diaphoretic.  HENT:     Head: Normocephalic.  Eyes:     General: No scleral icterus.       Right eye: No discharge.        Left eye: No discharge.     Conjunctiva/sclera: Conjunctivae normal.  Cardiovascular:     Rate and Rhythm: Normal rate and regular rhythm.      Heart sounds: Normal heart sounds.  Pulmonary:     Effort: Pulmonary effort is normal. No respiratory distress.     Breath sounds: Normal breath sounds.  Musculoskeletal:        General: Normal range of motion.  Skin:    General: Skin is warm and dry.  Neurological:     General: No focal deficit present.     Mental Status: She is alert and oriented to person, place, and time. Mental status is at baseline.  Psychiatric:        Mood and Affect: Mood normal.        Behavior: Behavior normal.        Thought Content: Thought content normal.        Judgment: Judgment normal.     Assessment and Plan: Encounter for preventive health examination Assessment & Plan: age appropriate education and counseling updated, referrals for preventative services and immunizations addressed, dietary and smoking counseling addressed, most recent labs reviewed.  I have personally reviewed and have noted:   1) the patient's medical and social history 2) The pt's use of alcohol, tobacco, and illicit drugs 3) The patient's current medications and supplements 4) Functional ability including ADL's, fall risk, home safety risk, hearing and visual impairment 5) Diet and physical activities 6) Evidence for depression or mood disorder 7) The patient's height, weight, and BMI have been recorded in the chart    I have made referrals, and provided counseling and education based on review of the above    Essential hypertension Assessment & Plan: Changing medication to telmisartan staring dose 20 mg , with reduction in toprol xl to 12.5 mg daily for 3 days.  Rtc bmet one week   Lab Results  Component Value Date   CREATININE 0.83 11/25/2022   Lab Results  Component Value Date   MICROALBUR <0.7 11/25/2022   MICROALBUR <0.7 10/27/2017       Orders: -     Comprehensive metabolic panel -     Microalbumin / creatinine urine ratio -     Basic metabolic panel; Future  Hyperlipidemia, unspecified  hyperlipidemia type Assessment & Plan: Her ten year risk of CAD in 2017 ws 3% using the [facility] and has risen to   % with today's labs and new onset HTN.  Despite a normal coronary calcium cT scan (score was zero).  She has a coronary artery anomaly and a  strong FH of CAD .  She is willing to consider statin therapy if it will confer a benefit.  Will recommend low dose rosuvastatin  Lab Results  Component Value Date   CHOL 294 (H) 11/25/2022   HDL 72.00 11/25/2022   LDLCALC 205 (H) 11/25/2022   LDLDIRECT 193.0 11/25/2022   TRIG 84.0 11/25/2022   CHOLHDL 4 11/25/2022     Orders: -     Lipid panel -     LDL cholesterol, direct -  TSH -     CBC with Differential/Platelet -     Hemoglobin A1c  Encounter for screening mammogram for malignant neoplasm of breast -     3D Screening Mammogram, Left and Right; Future  Other fatigue -     B12 and Folate Panel  Generalized anxiety disorder Assessment & Plan: Intensified during COVID pandemic.  Controlled with minimal Prozac dose.  Continue medication per patient preference    Menopause syndrome Assessment & Plan: Symptoms resolved with  resuming HRT.  She has no contraindications   Seasonal affective disorder [facility]) Assessment & Plan: Continue prozac at 10 mg daily    Family history of early CAD Assessment & Plan: Her coronary calcium score is zero.  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  Dispense: 24 patch; Refill: 2 -     Metoprolol Succinate ER; Take 1 tablet (25 mg total) by mouth at bedtime.  Dispense: 90 tablet; Refill: 0 -     Telmisartan; Take 1 tablet (20 mg total) by  mouth daily.  Dispense: 90 tablet; Refill: 1    No follow-ups on file.  Sherlene Shams, MD

## 2022-11-25 NOTE — Assessment & Plan Note (Signed)

## 2022-11-25 NOTE — Assessment & Plan Note (Addendum)
Her ten year risk of CAD in 2017 ws 3% using the [facility] and has risen to   % with today's labs and new onset HTN.  Despite a normal coronary calcium cT scan (score was zero).  She has a coronary artery anomaly and a  strong FH of CAD .  She is willing to consider statin therapy if it will confer a benefit.  Will recommend low dose rosuvastatin  Lab Results  Component Value Date   CHOL 294 (H) 11/25/2022   HDL 72.00 11/25/2022   LDLCALC 205 (H) 11/25/2022   LDLDIRECT 193.0 11/25/2022   TRIG 84.0 11/25/2022   CHOLHDL 4 11/25/2022

## 2022-11-26 ENCOUNTER — Other Ambulatory Visit: Payer: Self-pay

## 2022-11-26 ENCOUNTER — Encounter
Admission: RE | Admit: 2022-11-26 | Discharge: 2022-11-26 | Disposition: A | Discharge status: Home / Self Care | Payer: 59 | Source: Ambulatory Visit | Attending: Cardiovascular Disease | Admitting: Cardiovascular Disease

## 2022-11-26 DIAGNOSIS — Z8249 Family history of ischemic heart disease and other diseases of the circulatory system: Secondary | ICD-10-CM

## 2022-11-26 DIAGNOSIS — R072 Precordial pain: Secondary | ICD-10-CM | POA: Diagnosis not present

## 2022-11-26 DIAGNOSIS — R7989 Other specified abnormal findings of blood chemistry: Secondary | ICD-10-CM | POA: Insufficient documentation

## 2022-11-26 LAB — NM MYOCAR MULTI W/SPECT W/WALL MOTION / EF
Angina Index: 0
Duke Treadmill Score: -4
Exercise duration (min): 6 min
Exercise duration (sec): 30 s
LV dias vol: 70 mL (ref 46–106)
LV sys vol: 18 mL
Nuc Stress EF: 74 %
Peak HR: 157 {beats}/min
Percent HR: 96 %
Rest HR: 67 {beats}/min
Rest Nuclear Isotope Dose: 10.8 mCi
SDS: 0
SRS: 16
SSS: 0
ST Depression (mm): 2 mm
Stress Nuclear Isotope Dose: 31.2 mCi
TID: 0.85

## 2022-11-26 MED ORDER — TECHNETIUM TC 99M TETROFOSMIN IV KIT
29.9600 | PACK | Freq: Once | INTRAVENOUS | Status: AC | PRN
  Administered 2022-11-26: 29.96 via INTRAVENOUS

## 2022-11-26 MED ORDER — TECHNETIUM TC 99M TETROFOSMIN IV KIT
10.7900 | PACK | Freq: Once | INTRAVENOUS | Status: AC | PRN
  Administered 2022-11-26: 10.79 via INTRAVENOUS

## 2022-11-26 MED ORDER — TELMISARTAN 20 MG PO TABS
20.0000 mg | ORAL_TABLET | Freq: Every day | ORAL | 1 refills | Status: DC
  Filled 2022-11-26: qty 90, 90d supply, fill #0
  Filled 2023-03-10: qty 90, 90d supply, fill #1

## 2022-11-26 NOTE — Assessment & Plan Note (Signed)
Changing medication to telmisartan staring dose 20 mg , with reduction in toprol xl to 12.5 mg daily for 3 days.  Rtc bmet one week   Lab Results  Component Value Date   CREATININE 0.83 11/25/2022   Lab Results  Component Value Date   MICROALBUR <0.7 11/25/2022   MICROALBUR <0.7 10/27/2017

## 2022-11-26 NOTE — Assessment & Plan Note (Signed)
Checking fo ruse of biotin before treating   Lab Results  Component Value Date   TSH 7.78 (H) 11/25/2022

## 2022-11-26 NOTE — Addendum Note (Signed)
Addended by: Sherlene Shams on: 11/26/2022 11:49 AM   Modules accepted: Orders, Level of Service

## 2022-12-02 ENCOUNTER — Telehealth: Payer: Self-pay | Admitting: Internal Medicine

## 2022-12-02 NOTE — Telephone Encounter (Signed)
DR Jenne Campus NEEDS A NON FASTING LAB APPT NEXT MONDAY  CAN YOU  ? PLEASE CALL HER AND SET UP?  THANKS YOU

## 2022-12-02 NOTE — Telephone Encounter (Signed)
Left voice mail for a call back

## 2022-12-06 NOTE — Telephone Encounter (Signed)
Pt is scheduled for labs on 08/08 at 9:15

## 2022-12-09 ENCOUNTER — Other Ambulatory Visit: Payer: Self-pay

## 2022-12-09 ENCOUNTER — Other Ambulatory Visit (INDEPENDENT_AMBULATORY_CARE_PROVIDER_SITE_OTHER): Payer: 59

## 2022-12-09 DIAGNOSIS — I10 Essential (primary) hypertension: Secondary | ICD-10-CM | POA: Diagnosis not present

## 2022-12-09 DIAGNOSIS — R7989 Other specified abnormal findings of blood chemistry: Secondary | ICD-10-CM | POA: Diagnosis not present

## 2022-12-09 LAB — BASIC METABOLIC PANEL
BUN: 21 mg/dL (ref 6–23)
CO2: 29 mEq/L (ref 19–32)
Calcium: 9.3 mg/dL (ref 8.4–10.5)
Chloride: 100 mEq/L (ref 96–112)
Creatinine, Ser: 0.86 mg/dL (ref 0.40–1.20)
GFR: 74.68 mL/min (ref >60.00)
Glucose, Bld: 80 mg/dL (ref 70–99)
Potassium: 5 mEq/L (ref 3.5–5.1)
Sodium: 135 mEq/L (ref 135–145)

## 2022-12-11 ENCOUNTER — Other Ambulatory Visit: Payer: Self-pay | Admitting: Internal Medicine

## 2022-12-11 DIAGNOSIS — E038 Other specified hypothyroidism: Secondary | ICD-10-CM

## 2022-12-17 ENCOUNTER — Other Ambulatory Visit: Payer: Self-pay

## 2022-12-17 MED ORDER — CEPHALEXIN 500 MG PO CAPS
500.0000 mg | ORAL_CAPSULE | Freq: Three times a day (TID) | ORAL | 0 refills | Status: DC
  Filled 2022-12-17: qty 42, 14d supply, fill #0

## 2022-12-17 MED ORDER — GENTAMICIN SULFATE 0.1 % EX OINT
TOPICAL_OINTMENT | Freq: Two times a day (BID) | CUTANEOUS | 0 refills | Status: DC
  Filled 2022-12-17: qty 30, 7d supply, fill #0

## 2023-01-06 ENCOUNTER — Other Ambulatory Visit: Payer: Self-pay

## 2023-01-06 ENCOUNTER — Encounter: Payer: Self-pay | Admitting: Cardiovascular Disease

## 2023-01-06 ENCOUNTER — Ambulatory Visit: Payer: 59 | Attending: Cardiovascular Disease | Admitting: Cardiovascular Disease

## 2023-01-06 VITALS — BP 110/70 | HR 68 | Ht 66.0 in | Wt 149.0 lb

## 2023-01-06 DIAGNOSIS — I10 Essential (primary) hypertension: Secondary | ICD-10-CM

## 2023-01-06 MED ORDER — EZETIMIBE 10 MG PO TABS
10.0000 mg | ORAL_TABLET | Freq: Every day | ORAL | 3 refills | Status: DC
  Filled 2023-01-06: qty 90, 90d supply, fill #0

## 2023-01-06 NOTE — Progress Notes (Signed)
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 Medications reviewed by the patient verbally.     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   Myoview November 26, 2022 Nonspecific ST changes on treadmill,1-2 millimeter upsloping ST depressions at peak exercise Perfusion imaging showing homogeneous tracer uptake, no significant ischemia  Recent travel to [place], altitude, hiking a mountain Some shortness of breath on hiking but otherwise felt well  Denied any recent near-syncope or syncope,  Rare palpitations at nighttime possibly after drinking alcohol, short-lived, resolved without intervention  Continues on metoprolol succinate 25 daily, telmisartan 20 daily Blood pressure currently well-controlled Blood pressure low on  office visit today, denies orthostasis  Tries to eat healthy, no regular exercise program  Lab work reviewed,   LDL greater than 200  Strong family history; brother with coronary disease, MI at 25, bypass, defibrillator. He was a smoker Father with coronary disease at 24, bypass surgery, stenting, PAD     PMH:   has a past medical history of Cervical spine fracture (HCC), Closed wedge compression  fracture of T6 vertebra with routine healing (09/07/2015), Hyperlipidemia, Hypertension, Menopause syndrome, and Traumatic closed fracture of C6 vertebra with minimal displacement (HCC) (09/07/2015).  PSH:    Past Surgical History:  Procedure Laterality Date   ABDOMINAL HYSTERECTOMY  2009   total   APPENDECTOMY  1982   BILATERAL SALPINGOOPHORECTOMY  2008    Current Outpatient Medications  Medication Sig Dispense Refill   estradiol (VIVELLE-DOT) 0.05 MG/24HR patch PLACE 1 PATCH ONTO THE SKIN 2 TIMES A WEEK. 24 patch 2   FLUoxetine (PROZAC) 10 MG capsule Take 1 capsule (10 mg total) by mouth daily. 90 capsule 1   metoprolol succinate (TOPROL-XL) 25 MG 24 hr tablet Take 1 tablet (25 mg total) by mouth at bedtime. 90 tablet 0   telmisartan (MICARDIS) 20 MG tablet Take 1 tablet (20 mg total) by mouth daily. 90 tablet 1   No current facility-administered medications for this visit.     Allergies:   Patient has no known allergies.   Social History:  The patient  reports that she quit smoking about 35 years ago. Her smoking use included cigarettes. She started smoking about 42 years ago. She has never used smokeless tobacco. She reports current alcohol use of about 7.0 standard drinks of alcohol per week. She reports that she does not use drugs.   Family History:   family history includes Cancer (age of onset: 76) in her mother; Heart attack in her brother; Heart attack (age of onset: 30) in her brother; Heart disease (age of onset: 14) in her brother; Heart disease (age of onset: 71) in her father; Heart disease (age of onset: 70) in her brother; Hyperlipidemia in her father.    Review of Systems: Review of  Systems  Constitutional: Negative.   HENT: Negative.    Respiratory: Negative.    Cardiovascular: Negative.   Gastrointestinal: Negative.   Musculoskeletal: Negative.   Neurological: Negative.   Psychiatric/Behavioral: Negative.    All other systems reviewed and are  negative.    PHYSICAL EXAM: VS:  BP 110/70 (BP Location: Right Arm, Patient Position: Sitting, Cuff Size: Normal)   Pulse 68   Ht 5\' 6"  (1.676 m)   Wt 149 lb (67.6 kg)   SpO2 98%   BMI 24.05 kg/m  , BMI Body mass index is 24.05 kg/m. Constitutional:  oriented to person, place, and time. No distress.  HENT:  Head: Grossly normal Eyes:  no discharge. No scleral icterus.  Neck: No JVD, no carotid bruits  Cardiovascular: Regular rate and rhythm, no murmurs appreciated Pulmonary/Chest: Clear to auscultation bilaterally, no wheezes or rails Abdominal: Soft.  no distension.  no tenderness.  Musculoskeletal: Normal range of motion Neurological:  normal muscle tone. Coordination normal. No atrophy Skin: Skin warm and dry Psychiatric: normal affect, pleasant  Recent Labs: 11/25/2022: ALT 15; Hemoglobin 13.2; Platelets 304.0 12/09/2022: BUN 21; Creatinine, Ser 0.86; Potassium 5.0; Sodium 135; TSH 7.34    Lipid Panel Lab Results  Component Value Date   CHOL 294 (H) 11/25/2022   HDL 72.00 11/25/2022   LDLCALC 205 (H) 11/25/2022   TRIG 84.0 11/25/2022      Wt Readings from Last 3 Encounters:  01/06/23 149 lb (67.6 kg)  11/25/22 147 lb 6.4 oz (66.9 kg)  11/13/20 146 lb (66.2 kg)     ASSESSMENT AND PLAN:  Problem List Items Addressed This Visit       Cardiology Problems   Essential hypertension - Primary   Anomalous right coronary artery  originating from the left sinus of Valsalva with inter-arterial course (between aorta and main pulmonary artery). -asymptomatic, denies near-syncope, syncope, concerns for arrhythmia -Treadmill Myoview with no significant ischemia by tracer uptake, -Nonspecific ST abnormality at peak exercise without symptoms -Excellent blood pressure control -Recommended she continue current regiment, no strong indication for referral to CT surgery in the light of no symptoms -Consider repeat treadmill Myoview every 3 years or earlier for any new  symptoms -Discussed other imaging studies such as cardiac MRI, cardiac catheterization with IVUS/FFR  Hyperlipidemia Markedly elevated LDL 205, Calcium score 0 In effort to avoid side effects such as may come from statins, she will start Zetia 10 mg daily Could also add bempedoic acid after repeat numbers in several months time  Elevated blood pressure, palpitations On metoprolol succinate 25 daily and telmisartan milligrams daily, blood pressure excellent    Total encounter time more than 20 minutes  Greater than 50% was spent in counseling and coordination of care with the patient    Signed, Dossie Arbour, M.D., Ph.D. [facility] [place], [place] [phone]

## 2023-01-06 NOTE — Patient Instructions (Signed)
Medication Instructions:  Please start zetia 10 mg daily  If you need a refill on your cardiac medications before your next appointment, please call your pharmacy.   Lab work: No new labs needed  Testing/Procedures: No new testing needed  Follow-Up: At [facility], you and your health needs are our priority.  As part of our continuing mission to provide you with exceptional heart care, we have created designated Provider Care Teams.  These Care Teams include your primary Cardiologist (physician) and Advanced Practice Providers (APPs -  Physician Assistants and Nurse Practitioners) who all work together to provide you with the care you need, when you need it.  You will need a follow up appointment in 12 months  Providers on your designated Care Team:   Christopher Berge, NP Ryan Dunn, PA-C Cadence Furth, PA-C  COVID-19 Vaccine Information can be found at: [email or web address] For questions related to vaccine distribution or appointments, please email [email or web address] or call [phone].   

## 2023-01-24 ENCOUNTER — Other Ambulatory Visit: Payer: Self-pay

## 2023-01-24 MED FILL — Fluoxetine HCl Cap 10 MG: ORAL | 90 days supply | Qty: 90 | Fill #1 | Status: AC

## 2023-02-17 DIAGNOSIS — L601 Onycholysis: Secondary | ICD-10-CM | POA: Diagnosis not present

## 2023-02-17 DIAGNOSIS — D2271 Melanocytic nevi of right lower limb, including hip: Secondary | ICD-10-CM | POA: Diagnosis not present

## 2023-02-17 DIAGNOSIS — L918 Other hypertrophic disorders of the skin: Secondary | ICD-10-CM | POA: Diagnosis not present

## 2023-02-17 DIAGNOSIS — D2261 Melanocytic nevi of right upper limb, including shoulder: Secondary | ICD-10-CM | POA: Diagnosis not present

## 2023-02-17 DIAGNOSIS — D225 Melanocytic nevi of trunk: Secondary | ICD-10-CM | POA: Diagnosis not present

## 2023-02-17 DIAGNOSIS — D2262 Melanocytic nevi of left upper limb, including shoulder: Secondary | ICD-10-CM | POA: Diagnosis not present

## 2023-02-17 DIAGNOSIS — L821 Other seborrheic keratosis: Secondary | ICD-10-CM | POA: Diagnosis not present

## 2023-02-17 DIAGNOSIS — D2272 Melanocytic nevi of left lower limb, including hip: Secondary | ICD-10-CM | POA: Diagnosis not present

## 2023-03-10 ENCOUNTER — Other Ambulatory Visit: Payer: Self-pay | Admitting: Internal Medicine

## 2023-03-10 ENCOUNTER — Other Ambulatory Visit: Payer: Self-pay

## 2023-03-10 MED ORDER — METOPROLOL SUCCINATE ER 25 MG PO TB24
25.0000 mg | ORAL_TABLET | Freq: Every day | ORAL | 0 refills | Status: DC
  Filled 2023-03-10: qty 90, 90d supply, fill #0

## 2023-04-04 ENCOUNTER — Other Ambulatory Visit: Payer: Self-pay | Admitting: Internal Medicine

## 2023-04-04 ENCOUNTER — Telehealth: Payer: Self-pay

## 2023-04-04 ENCOUNTER — Other Ambulatory Visit: Payer: 59

## 2023-04-04 DIAGNOSIS — E038 Other specified hypothyroidism: Secondary | ICD-10-CM | POA: Diagnosis not present

## 2023-04-04 NOTE — Telephone Encounter (Signed)
LMTCB. Need to schedule pt for a nonfasting lab appt.  

## 2023-04-04 NOTE — Telephone Encounter (Signed)
Spoke to pt, scheduled lab appt for today, 12/2

## 2023-04-06 LAB — THYROGLOBULIN ANTIBODY: Thyroglobulin Ab: <1 [IU]/mL (ref <=1)

## 2023-04-06 LAB — THYROID PANEL WITH TSH
Free Thyroxine Index: 1.7 (ref 1.4–3.8)
T3 Uptake: 29 % (ref 22–35)
T4, Total: 6 ug/dL (ref 5.1–11.9)
TSH: 5.16 m[IU]/L — ABNORMAL HIGH (ref 0.40–4.50)

## 2023-04-06 LAB — THYROGLOBULIN LEVEL: Thyroglobulin: 9 ng/mL

## 2023-04-06 LAB — THYROID PEROXIDASE ANTIBODY: Thyroperoxidase Ab SerPl-aCnc: 1 [IU]/mL (ref <9)

## 2023-04-20 ENCOUNTER — Other Ambulatory Visit: Payer: Self-pay | Admitting: Internal Medicine

## 2023-04-20 ENCOUNTER — Other Ambulatory Visit: Payer: Self-pay

## 2023-04-21 ENCOUNTER — Other Ambulatory Visit: Payer: Self-pay

## 2023-04-21 MED FILL — Fluoxetine HCl Cap 10 MG: ORAL | 90 days supply | Qty: 90 | Fill #0 | Status: AC

## 2023-06-20 ENCOUNTER — Other Ambulatory Visit: Payer: Self-pay

## 2023-06-20 ENCOUNTER — Other Ambulatory Visit: Payer: Self-pay | Admitting: Internal Medicine

## 2023-06-20 MED ORDER — METOPROLOL SUCCINATE ER 25 MG PO TB24
25.0000 mg | ORAL_TABLET | Freq: Every day | ORAL | 0 refills | Status: DC
  Filled 2023-06-20: qty 90, 90d supply, fill #0

## 2023-06-20 MED ORDER — TELMISARTAN 20 MG PO TABS
20.0000 mg | ORAL_TABLET | Freq: Every day | ORAL | 1 refills | Status: DC
  Filled 2023-06-20: qty 90, 90d supply, fill #0
  Filled 2023-09-16: qty 90, 90d supply, fill #1

## 2023-07-07 ENCOUNTER — Ambulatory Visit
Admission: RE | Admit: 2023-07-07 | Discharge: 2023-07-07 | Disposition: A | Discharge status: Home / Self Care | Payer: 59 | Source: Ambulatory Visit | Attending: Internal Medicine | Admitting: Internal Medicine

## 2023-07-07 DIAGNOSIS — Z1231 Encounter for screening mammogram for malignant neoplasm of breast: Secondary | ICD-10-CM | POA: Insufficient documentation

## 2023-07-11 ENCOUNTER — Other Ambulatory Visit: Payer: Self-pay | Admitting: Internal Medicine

## 2023-07-11 ENCOUNTER — Encounter: Payer: Self-pay | Admitting: Internal Medicine

## 2023-07-11 DIAGNOSIS — E038 Other specified hypothyroidism: Secondary | ICD-10-CM

## 2023-07-11 DIAGNOSIS — Z1211 Encounter for screening for malignant neoplasm of colon: Secondary | ICD-10-CM

## 2023-07-12 ENCOUNTER — Encounter: Payer: Self-pay | Admitting: Internal Medicine

## 2023-07-20 MED FILL — Fluoxetine HCl Cap 10 MG: ORAL | 90 days supply | Qty: 90 | Fill #1 | Status: AC

## 2023-07-25 DIAGNOSIS — Z1211 Encounter for screening for malignant neoplasm of colon: Secondary | ICD-10-CM | POA: Diagnosis not present

## 2023-07-28 LAB — COLOGUARD: COLOGUARD: NEGATIVE

## 2023-07-29 ENCOUNTER — Encounter: Payer: Self-pay | Admitting: Internal Medicine

## 2023-08-13 ENCOUNTER — Other Ambulatory Visit: Payer: Self-pay | Admitting: Medical Genetics

## 2023-09-16 ENCOUNTER — Other Ambulatory Visit: Payer: Self-pay | Admitting: Internal Medicine

## 2023-09-16 ENCOUNTER — Other Ambulatory Visit: Payer: Self-pay

## 2023-09-16 MED FILL — Estradiol TD Patch Twice Weekly 0.05 MG/24HR: TRANSDERMAL | 84 days supply | Qty: 24 | Fill #0 | Status: AC

## 2023-09-16 MED FILL — Metoprolol Succinate Tab ER 24HR 25 MG (Tartrate Equiv): ORAL | 30 days supply | Qty: 30 | Fill #0 | Status: AC

## 2023-09-19 ENCOUNTER — Other Ambulatory Visit: Payer: Self-pay

## 2023-10-20 ENCOUNTER — Other Ambulatory Visit: Payer: Self-pay | Admitting: Internal Medicine

## 2023-10-20 ENCOUNTER — Other Ambulatory Visit: Payer: Self-pay

## 2023-10-20 MED FILL — Metoprolol Succinate Tab ER 24HR 25 MG (Tartrate Equiv): ORAL | 30 days supply | Qty: 30 | Fill #1 | Status: AC

## 2023-10-21 ENCOUNTER — Other Ambulatory Visit: Payer: Self-pay

## 2023-10-24 ENCOUNTER — Other Ambulatory Visit: Payer: Self-pay

## 2023-10-24 MED FILL — Fluoxetine HCl Cap 10 MG: ORAL | 30 days supply | Qty: 30 | Fill #0 | Status: AC

## 2023-11-10 DIAGNOSIS — H52223 Regular astigmatism, bilateral: Secondary | ICD-10-CM | POA: Diagnosis not present

## 2023-11-10 DIAGNOSIS — H524 Presbyopia: Secondary | ICD-10-CM | POA: Diagnosis not present

## 2023-11-10 DIAGNOSIS — H5203 Hypermetropia, bilateral: Secondary | ICD-10-CM | POA: Diagnosis not present

## 2023-11-14 ENCOUNTER — Other Ambulatory Visit: Payer: Self-pay | Admitting: Internal Medicine

## 2023-11-14 DIAGNOSIS — E782 Mixed hyperlipidemia: Secondary | ICD-10-CM

## 2023-11-14 DIAGNOSIS — R7301 Impaired fasting glucose: Secondary | ICD-10-CM

## 2023-11-14 DIAGNOSIS — R7989 Other specified abnormal findings of blood chemistry: Secondary | ICD-10-CM

## 2023-11-14 DIAGNOSIS — I10 Essential (primary) hypertension: Secondary | ICD-10-CM

## 2023-11-17 ENCOUNTER — Other Ambulatory Visit
Admission: RE | Admit: 2023-11-17 | Discharge: 2023-11-17 | Disposition: A | Discharge status: Home / Self Care | Attending: Internal Medicine | Admitting: Internal Medicine

## 2023-11-17 ENCOUNTER — Other Ambulatory Visit
Admission: RE | Admit: 2023-11-17 | Discharge: 2023-11-17 | Disposition: A | Discharge status: Home / Self Care | Payer: Self-pay | Source: Ambulatory Visit | Attending: Medical Genetics | Admitting: Medical Genetics

## 2023-11-17 DIAGNOSIS — R7989 Other specified abnormal findings of blood chemistry: Secondary | ICD-10-CM | POA: Insufficient documentation

## 2023-11-17 DIAGNOSIS — E038 Other specified hypothyroidism: Secondary | ICD-10-CM | POA: Diagnosis present

## 2023-11-17 DIAGNOSIS — I10 Essential (primary) hypertension: Secondary | ICD-10-CM | POA: Diagnosis present

## 2023-11-17 DIAGNOSIS — E782 Mixed hyperlipidemia: Secondary | ICD-10-CM | POA: Insufficient documentation

## 2023-11-17 DIAGNOSIS — R7301 Impaired fasting glucose: Secondary | ICD-10-CM | POA: Diagnosis present

## 2023-11-17 LAB — COMPREHENSIVE METABOLIC PANEL WITH GFR
ALT: 20 U/L (ref 0–44)
AST: 22 U/L (ref 15–41)
Albumin: 4 g/dL (ref 3.5–5.0)
Alkaline Phosphatase: 48 U/L (ref 38–126)
Anion gap: 8 (ref 5–15)
BUN: 16 mg/dL (ref 6–20)
CO2: 25 mmol/L (ref 22–32)
Calcium: 9 mg/dL (ref 8.9–10.3)
Chloride: 103 mmol/L (ref 98–111)
Creatinine, Ser: 0.76 mg/dL (ref 0.44–1.00)
GFR, Estimated: >60 mL/min (ref >60)
Glucose, Bld: 106 mg/dL — ABNORMAL HIGH (ref 70–99)
Potassium: 4.3 mmol/L (ref 3.5–5.1)
Sodium: 136 mmol/L (ref 135–145)
Total Bilirubin: 0.9 mg/dL (ref 0.0–1.2)
Total Protein: 6.6 g/dL (ref 6.5–8.1)

## 2023-11-17 LAB — T4, FREE: Free T4: 0.66 ng/dL (ref 0.61–1.12)

## 2023-11-17 LAB — LIPID PANEL
Cholesterol: 290 mg/dL — ABNORMAL HIGH (ref 0–200)
HDL: 67 mg/dL (ref >40)
LDL Cholesterol: 204 mg/dL — ABNORMAL HIGH (ref 0–99)
Total CHOL/HDL Ratio: 4.3 ratio
Triglycerides: 97 mg/dL (ref <150)
VLDL: 19 mg/dL (ref 0–40)

## 2023-11-17 LAB — HEMOGLOBIN A1C
Hgb A1c MFr Bld: 4.8 % (ref 4.8–5.6)
Mean Plasma Glucose: 91.06 mg/dL

## 2023-11-17 LAB — TSH: TSH: 10.292 u[IU]/mL — ABNORMAL HIGH (ref 0.350–4.500)

## 2023-11-17 LAB — LDL CHOLESTEROL, DIRECT: Direct LDL: 194 mg/dL — ABNORMAL HIGH (ref 0–99)

## 2023-11-18 LAB — THYROID PANEL WITH TSH
Free Thyroxine Index: 1.4 (ref 1.2–4.9)
T3 Uptake Ratio: 26 % (ref 24–39)
T4, Total: 5.4 ug/dL (ref 4.5–12.0)
TSH: 8.84 u[IU]/mL — ABNORMAL HIGH (ref 0.450–4.500)

## 2023-11-18 LAB — LIPOPROTEIN A (LPA): Lipoprotein (a): 224.2 nmol/L — ABNORMAL HIGH (ref <75.0)

## 2023-11-18 LAB — HIGH SENSITIVITY CRP: CRP, High Sensitivity: 3.34 mg/L — ABNORMAL HIGH (ref 0.00–3.00)

## 2023-11-20 ENCOUNTER — Ambulatory Visit: Payer: Self-pay | Admitting: Internal Medicine

## 2023-11-21 ENCOUNTER — Other Ambulatory Visit: Payer: Self-pay | Admitting: Internal Medicine

## 2023-11-21 ENCOUNTER — Other Ambulatory Visit: Payer: Self-pay

## 2023-11-21 DIAGNOSIS — E039 Hypothyroidism, unspecified: Secondary | ICD-10-CM

## 2023-11-21 MED ORDER — LEVOTHYROXINE SODIUM 50 MCG PO TABS
50.0000 ug | ORAL_TABLET | Freq: Every day | ORAL | 1 refills | Status: DC
  Filled 2023-11-21: qty 90, 90d supply, fill #0

## 2023-11-24 ENCOUNTER — Other Ambulatory Visit: Payer: Self-pay

## 2023-11-24 ENCOUNTER — Other Ambulatory Visit: Payer: Self-pay | Admitting: Internal Medicine

## 2023-11-24 MED ORDER — FLUOXETINE HCL 10 MG PO CAPS
10.0000 mg | ORAL_CAPSULE | Freq: Every day | ORAL | 1 refills | Status: DC
  Filled 2023-11-24: qty 90, 90d supply, fill #0
  Filled 2024-02-09: qty 90, 90d supply, fill #1

## 2023-11-24 MED ORDER — ESTRADIOL 0.05 MG/24HR TD PTTW
1.0000 | MEDICATED_PATCH | TRANSDERMAL | 0 refills | Status: DC
  Filled 2023-11-24: qty 24, 84d supply, fill #0

## 2023-11-24 MED ORDER — TELMISARTAN 20 MG PO TABS
20.0000 mg | ORAL_TABLET | Freq: Every day | ORAL | 1 refills | Status: AC
Start: 2023-11-24 — End: ?
  Filled 2023-11-24 – 2023-12-22 (×2): qty 90, 90d supply, fill #0
  Filled 2024-03-21: qty 90, 90d supply, fill #1

## 2023-11-25 ENCOUNTER — Other Ambulatory Visit: Payer: Self-pay

## 2023-11-29 LAB — GENECONNECT MOLECULAR SCREEN: Genetic Analysis Overall Interpretation: NEGATIVE

## 2023-12-16 ENCOUNTER — Other Ambulatory Visit: Payer: Self-pay | Admitting: Internal Medicine

## 2023-12-16 ENCOUNTER — Other Ambulatory Visit: Payer: Self-pay

## 2023-12-16 MED ORDER — NEBIVOLOL HCL 2.5 MG PO TABS
2.5000 mg | ORAL_TABLET | Freq: Every day | ORAL | 3 refills | Status: AC
  Filled 2023-12-16: qty 90, 90d supply, fill #0
  Filled 2024-03-21: qty 90, 90d supply, fill #1

## 2023-12-22 ENCOUNTER — Other Ambulatory Visit: Payer: Self-pay

## 2024-01-18 ENCOUNTER — Other Ambulatory Visit: Payer: Self-pay | Admitting: Internal Medicine

## 2024-01-18 DIAGNOSIS — R7989 Other specified abnormal findings of blood chemistry: Secondary | ICD-10-CM

## 2024-01-18 DIAGNOSIS — E7800 Pure hypercholesterolemia, unspecified: Secondary | ICD-10-CM

## 2024-01-18 DIAGNOSIS — E039 Hypothyroidism, unspecified: Secondary | ICD-10-CM

## 2024-01-19 ENCOUNTER — Other Ambulatory Visit (INDEPENDENT_AMBULATORY_CARE_PROVIDER_SITE_OTHER)

## 2024-01-19 DIAGNOSIS — E7800 Pure hypercholesterolemia, unspecified: Secondary | ICD-10-CM

## 2024-01-19 DIAGNOSIS — R7989 Other specified abnormal findings of blood chemistry: Secondary | ICD-10-CM

## 2024-01-19 NOTE — Addendum Note (Signed)
 Addended by: MARYLEN PRO A on: 01/19/2024 07:42 AM   Modules accepted: Orders

## 2024-01-19 NOTE — Addendum Note (Signed)
 Addended by: MARYLEN PRO A on: 01/19/2024 07:41 AM   Modules accepted: Orders

## 2024-01-21 ENCOUNTER — Other Ambulatory Visit: Payer: Self-pay

## 2024-01-21 ENCOUNTER — Ambulatory Visit: Payer: Self-pay | Admitting: Internal Medicine

## 2024-01-21 LAB — LIPID PANEL
Chol/HDL Ratio: 4.8 ratio — ABNORMAL HIGH (ref 0.0–4.4)
Cholesterol, Total: 315 mg/dL — ABNORMAL HIGH (ref 100–199)
HDL: 66 mg/dL (ref >39)
LDL Chol Calc (NIH): 218 mg/dL — ABNORMAL HIGH (ref 0–99)
Triglycerides: 166 mg/dL — ABNORMAL HIGH (ref 0–149)
VLDL Cholesterol Cal: 31 mg/dL (ref 5–40)

## 2024-01-21 LAB — THYROID PANEL WITH TSH
Free Thyroxine Index: 2 (ref 1.2–4.9)
T3 Uptake Ratio: 31 % (ref 24–39)
T4, Total: 6.6 ug/dL (ref 4.5–12.0)
TSH: 4.7 u[IU]/mL — ABNORMAL HIGH (ref 0.450–4.500)

## 2024-01-21 LAB — LIPOPROTEIN ANALYSIS BY NMR
HDL Particle Number: 40.6 umol/L (ref >=30.5)
LDL Particle Number: 3070 nmol/L — ABNORMAL HIGH (ref <1000)
LDL Size: 21.7 nm (ref >20.5)
LP-IR Score: 49 — ABNORMAL HIGH (ref <=45)
Small LDL Particle Number: 1122 nmol/L — ABNORMAL HIGH (ref <=527)

## 2024-01-21 LAB — LDL CHOLESTEROL, DIRECT: LDL Direct: 219 mg/dL — ABNORMAL HIGH (ref 0–99)

## 2024-01-21 MED ORDER — LEVOTHYROXINE SODIUM 75 MCG PO TABS
75.0000 ug | ORAL_TABLET | Freq: Every day | ORAL | 1 refills | Status: AC
  Filled 2024-01-21: qty 90, 90d supply, fill #0
  Filled 2024-04-23: qty 90, 90d supply, fill #1

## 2024-01-21 NOTE — Addendum Note (Signed)
 Addended by: MARYLYNN VERNEITA CROME on: 01/21/2024 12:32 PM   Modules accepted: Orders

## 2024-01-23 ENCOUNTER — Other Ambulatory Visit: Payer: Self-pay

## 2024-01-23 ENCOUNTER — Encounter: Payer: Self-pay | Admitting: Pharmacist

## 2024-01-23 MED ORDER — ALBUTEROL SULFATE HFA 108 (90 BASE) MCG/ACT IN AERS
2.0000 | INHALATION_SPRAY | Freq: Four times a day (QID) | RESPIRATORY_TRACT | 2 refills | Status: DC | PRN
  Filled 2024-01-23: qty 18, 25d supply, fill #0

## 2024-01-23 MED ORDER — PROAIR RESPICLICK 108 (90 BASE) MCG/ACT IN AEPB
2.0000 | INHALATION_SPRAY | RESPIRATORY_TRACT | 11 refills | Status: DC | PRN
  Filled 2024-01-23: qty 1, 17d supply, fill #0

## 2024-01-25 ENCOUNTER — Other Ambulatory Visit: Payer: Self-pay

## 2024-01-25 MED ORDER — AZITHROMYCIN 250 MG PO TABS
ORAL_TABLET | ORAL | 0 refills | Status: DC
  Filled 2024-01-25: qty 6, 5d supply, fill #0

## 2024-01-25 MED ORDER — AMOXICILLIN-POT CLAVULANATE 875-125 MG PO TABS
1.0000 | ORAL_TABLET | Freq: Two times a day (BID) | ORAL | 0 refills | Status: DC
  Filled 2024-01-25: qty 28, 14d supply, fill #0

## 2024-02-09 ENCOUNTER — Other Ambulatory Visit: Payer: Self-pay

## 2024-04-23 ENCOUNTER — Other Ambulatory Visit: Payer: Self-pay

## 2024-04-24 ENCOUNTER — Other Ambulatory Visit: Payer: Self-pay

## 2024-04-25 ENCOUNTER — Other Ambulatory Visit: Payer: Self-pay

## 2024-04-27 ENCOUNTER — Other Ambulatory Visit: Payer: Self-pay

## 2024-05-15 ENCOUNTER — Other Ambulatory Visit: Payer: Self-pay

## 2024-05-15 MED ORDER — VALACYCLOVIR HCL 1 G PO TABS
1000.0000 mg | ORAL_TABLET | Freq: Three times a day (TID) | ORAL | 3 refills | Status: DC
  Filled 2024-05-15: qty 30, 10d supply, fill #0

## 2024-05-24 ENCOUNTER — Other Ambulatory Visit: Payer: Self-pay | Admitting: Internal Medicine

## 2024-05-25 ENCOUNTER — Other Ambulatory Visit: Payer: Self-pay

## 2024-05-25 MED ORDER — FLUOXETINE HCL 10 MG PO CAPS
10.0000 mg | ORAL_CAPSULE | Freq: Every day | ORAL | 1 refills | Status: AC
  Filled 2024-05-25: qty 90, 90d supply, fill #0

## 2024-05-25 NOTE — Telephone Encounter (Signed)
 LVM to call back to schedule f/up pt has not been seen since 11/2023 please schedule when pt returns call

## 2024-06-01 ENCOUNTER — Other Ambulatory Visit: Payer: Self-pay

## 2024-06-01 MED ORDER — ONDANSETRON HCL 4 MG PO TABS
4.0000 mg | ORAL_TABLET | Freq: Four times a day (QID) | ORAL | 0 refills | Status: AC | PRN
  Filled 2024-06-01: qty 60, 15d supply, fill #0

## 2024-06-03 ENCOUNTER — Telehealth: Payer: Self-pay

## 2024-06-03 NOTE — Telephone Encounter (Signed)
 I left voicemail for patient asking her to please call us  back to reschedule her 06/04/2024 appointment with Dr. Verneita Kettering, as we will be closed due to inclement weather.  I also sent a message to patient via MyChart.  E2C2 - when patient calls back, please assist her with rescheduling her appointment.

## 2024-06-03 NOTE — Progress Notes (Unsigned)
 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 with history of  hyperlipidemia, strong family history of coronary artery disease,  remote history of syncope felt secondary to orthostasis/vasovagal 20 years ago after party Presenting to the office for follow-up of her hyperlipidemia, strong family history coronary disease, tachycardia,  anomalous right coronary artery  LOV 9/24 Reports feeling well, active at baseline, continues to exercise - Does not push her exercise to the extreme that she used to out of concern for anomalous coronary artery  Denies symptoms of near-syncope or syncope  Blood pressure well-controlled at home on Bystolic  2.5 , telmisartan  20 daily  Concerned about recent lipid panel, high numbers as detailed below  Family doing well, 2 children in medicine pediatrics and ER, both engaged  Prior imaging reviewed Cardiac CTA August 2022 Normal coronary arteries, calcium score 0 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   Myoview  November 26, 2022 Nonspecific ST changes on treadmill,1-2 millimeter upsloping ST depressions at peak exercise Perfusion imaging showing homogeneous tracer uptake, no significant ischemia  Lab work reviewed LDL particle 3000 Small LDL particle #1122 LDL greater than 200  Strong family history; brother with coronary disease, MI at 104, bypass, defibrillator. He was a smoker Father with coronary disease at 17, bypass surgery, stenting, PAD   PMH:   has a past medical history of Cervical spine fracture (HCC), Closed wedge compression fracture of T6 vertebra with routine healing  (09/07/2015), Hyperlipidemia, Hypertension, Menopause syndrome, and Traumatic closed fracture of C6 vertebra with minimal displacement (HCC) (09/07/2015).  PSH:    Past Surgical History:  Procedure Laterality Date   ABDOMINAL HYSTERECTOMY  2009   total   APPENDECTOMY  1982   BILATERAL SALPINGOOPHORECTOMY  2008    Current Outpatient Medications  Medication Sig Dispense Refill   FLUoxetine  (PROZAC ) 10 MG capsule Take 1 capsule (10 mg total) by mouth daily. 90 capsule 1   levothyroxine  (SYNTHROID ) 75 MCG tablet Take 1 tablet (75 mcg total) by mouth daily before breakfast. 90 tablet 1   nebivolol  (BYSTOLIC ) 2.5 MG tablet Take 1 tablet (2.5 mg total) by mouth daily. 90 tablet 3   ondansetron  (ZOFRAN ) 4 MG tablet Take 1 tablet (4 mg total) by mouth every 6 - 8 hours as needed for nausea/vomitting. 60 tablet 0   telmisartan  (MICARDIS ) 20 MG tablet Take 1 tablet (20 mg total) by mouth daily. 90 tablet 1   triamcinolone  ointment (KENALOG ) 0.1 % Apply 1 Application topically as directed.     No current facility-administered medications for this visit.     Allergies:   Patient has no known allergies.   Social History:  The patient  reports that she quit smoking about 36 years ago. Her smoking use included cigarettes. She started smoking about 43 years ago. She has never used smokeless tobacco. She reports current alcohol use of about 7.0 standard drinks of alcohol per week. She reports that she does not use drugs.   Family History:   family history includes Cancer (age of onset: 67) in her mother; Heart attack in her brother; Heart attack (age of onset:  42) in her brother; Heart disease (age of onset: 39) in her brother; Heart disease (age of onset: 48) in her father; Heart disease (age of onset: 23) in her brother; Hyperlipidemia in her father.    Review of Systems: Review of Systems  Constitutional: Negative.   HENT: Negative.    Respiratory: Negative.    Cardiovascular: Negative.    Gastrointestinal: Negative.   Musculoskeletal: Negative.   Neurological: Negative.   Psychiatric/Behavioral: Negative.    All other systems reviewed and are negative.    PHYSICAL EXAM: VS:  BP (!) 130/90 (BP Location: Left Arm, Patient Position: Sitting, Cuff Size: Normal)   Pulse 65   Ht 5' 5 (1.651 m)   Wt 150 lb (68 kg)   SpO2 100%   BMI 24.96 kg/m  , BMI Body mass index is 24.96 kg/m. Constitutional:  oriented to person, place, and time. No distress.  HENT:  Head: Grossly normal Eyes:  no discharge. No scleral icterus.  Neck: No JVD, no carotid bruits  Cardiovascular: Regular rate and rhythm, no murmurs appreciated Pulmonary/Chest: Clear to auscultation bilaterally, no wheezes or rails Abdominal: Soft.  no distension.  no tenderness.  Musculoskeletal: Normal range of motion Neurological:  normal muscle tone. Coordination normal. No atrophy Skin: Skin warm and dry Psychiatric: normal affect, pleasant  Recent Labs: 11/17/2023: ALT 20; BUN 16; Creatinine, Ser 0.76; Potassium 4.3; Sodium 136 01/19/2024: TSH 4.700   Lipid Panel Lab Results  Component Value Date   CHOL 315 (H) 01/19/2024   HDL 66 01/19/2024   LDLCALC 218 (H) 01/19/2024   TRIG 166 (H) 01/19/2024    Wt Readings from Last 3 Encounters:  06/04/24 150 lb (68 kg)  01/06/23 149 lb (67.6 kg)  11/25/22 147 lb 6.4 oz (66.9 kg)     ASSESSMENT AND PLAN:  Problem List Items Addressed This Visit       Cardiology Problems   Hyperlipidemia     Other   Family history of early CAD   Relevant Orders   EKG 12-Lead (Completed)   Other Visit Diagnoses       Anomalous origin of right coronary artery    -  Primary   Relevant Orders   EKG 12-Lead (Completed)       Anomalous right coronary artery  originating from the left sinus of Valsalva with inter-arterial course (between aorta and main pulmonary artery). -Asymptomatic, no near-syncope or syncope or cardiac arrhythmia, no chest pain on exertion, no  shortness of breath  -July 2024 treadmill Myoview  with no significant ischemia (Nonspecific ST abnormality at peak exercise without symptoms) -Good exercise tolerance, blood pressure well-controlled - One could consider repeat exercise Myoview  at 3 years, July 2027  Hyperlipidemia Markedly elevated LDL 205, Calcium score 0 Cardiac CTA with no coronary disease -Reports she is not taking Zetia  Prefers not to be on a statin -Recommend she consider starting Repatha  140 subcu every 2 weeks  Elevated blood pressure, palpitations Continue metoprolol  and telmisartan , blood pressure well-controlled at home  Risk stratification Discussed carotid ultrasound screening study, she will call us  if interested   Signed, Velinda Lunger, M.D., Ph.D. [facility] [place], [place] [phone]

## 2024-06-04 ENCOUNTER — Ambulatory Visit: Admitting: Cardiovascular Disease

## 2024-06-04 ENCOUNTER — Other Ambulatory Visit: Payer: Self-pay

## 2024-06-04 ENCOUNTER — Encounter: Payer: Self-pay | Admitting: Cardiovascular Disease

## 2024-06-04 ENCOUNTER — Ambulatory Visit: Admitting: Internal Medicine

## 2024-06-04 VITALS — BP 130/90 | HR 65 | Ht 65.0 in | Wt 150.0 lb

## 2024-06-04 DIAGNOSIS — Z8249 Family history of ischemic heart disease and other diseases of the circulatory system: Secondary | ICD-10-CM

## 2024-06-04 DIAGNOSIS — Q245 Malformation of coronary vessels: Secondary | ICD-10-CM | POA: Diagnosis not present

## 2024-06-04 DIAGNOSIS — E782 Mixed hyperlipidemia: Secondary | ICD-10-CM

## 2024-06-04 MED ORDER — REPATHA SURECLICK 140 MG/ML ~~LOC~~ SOAJ
140.0000 mg | SUBCUTANEOUS | 3 refills | Status: AC
  Filled 2024-06-04 – 2024-06-06 (×3): qty 6, 84d supply, fill #0

## 2024-06-04 NOTE — Patient Instructions (Addendum)
 Medication Instructions:   Repatha  140 sq every 2 weeks  If you need a refill on your cardiac medications before your next appointment, please call your pharmacy.   Lab work: Your provider would like for you to return in 3-6 months to have the following labs drawn: Lipid Panel.   Please go to [facility] [address] ([facility] [address], [place] [address] You do not need an appointment.  They are open from 8 am- 4:30 pm.  Lunch from 1:00 pm- 2:00 pm You will need to be fasting.   Testing/Procedures: No new testing needed  Follow-Up: At [facility], you and your health needs are our priority.  As part of our continuing mission to provide you with exceptional heart care, we have created designated Provider Care Teams.  These Care Teams include your primary Cardiologist (physician) and Advanced Practice Providers (APPs -  Physician Assistants and Nurse Practitioners) who all work together to provide you with the care you need, when you need it.  You will need a follow up appointment in 12 months  Providers on your designated Care Team:   Lonni Meager, NP Bernardino Bring, PA-C Cadence [place], [place]  COVID-19 Vaccine Information can be found at: [email or web address] For questions related to vaccine distribution or appointments, please email [email or web address] or call [phone].

## 2024-06-06 ENCOUNTER — Other Ambulatory Visit: Payer: Self-pay

## 2024-06-06 ENCOUNTER — Other Ambulatory Visit (HOSPITAL_COMMUNITY): Payer: Self-pay

## 2024-06-08 ENCOUNTER — Other Ambulatory Visit: Payer: Self-pay

## 2024-06-08 ENCOUNTER — Other Ambulatory Visit (HOSPITAL_COMMUNITY): Payer: Self-pay
